# Patient Record
Sex: Male | Born: 1975 | Race: Black or African American | Hispanic: No | Marital: Married | Smoking: Former smoker
Health system: Southern US, Community
[De-identification: ages and names within clinical notes are randomized; demographics above are authoritative.]

## PROBLEM LIST (undated history)

## (undated) HISTORY — PX: OTHER SURGICAL HISTORY: SHX169

---

## 2015-03-23 ENCOUNTER — Encounter (HOSPITAL_BASED_OUTPATIENT_CLINIC_OR_DEPARTMENT_OTHER): Payer: Self-pay | Admitting: *Deleted

## 2015-03-23 ENCOUNTER — Emergency Department (HOSPITAL_BASED_OUTPATIENT_CLINIC_OR_DEPARTMENT_OTHER): Payer: Self-pay

## 2015-03-23 ENCOUNTER — Emergency Department (HOSPITAL_BASED_OUTPATIENT_CLINIC_OR_DEPARTMENT_OTHER)
Admission: EM | Admit: 2015-03-23 | Discharge: 2015-03-23 | Disposition: A | Discharge status: Home / Self Care | Payer: Self-pay | Attending: Emergency Medicine | Admitting: Emergency Medicine

## 2015-03-23 DIAGNOSIS — T148XXA Other injury of unspecified body region, initial encounter: Secondary | ICD-10-CM

## 2015-03-23 DIAGNOSIS — W298XXA Contact with other powered powered hand tools and household machinery, initial encounter: Secondary | ICD-10-CM | POA: Insufficient documentation

## 2015-03-23 DIAGNOSIS — Y990 Civilian activity done for income or pay: Secondary | ICD-10-CM | POA: Insufficient documentation

## 2015-03-23 DIAGNOSIS — Y9289 Other specified places as the place of occurrence of the external cause: Secondary | ICD-10-CM | POA: Insufficient documentation

## 2015-03-23 DIAGNOSIS — Z23 Encounter for immunization: Secondary | ICD-10-CM | POA: Insufficient documentation

## 2015-03-23 DIAGNOSIS — Y9389 Activity, other specified: Secondary | ICD-10-CM | POA: Insufficient documentation

## 2015-03-23 DIAGNOSIS — S61032A Puncture wound without foreign body of left thumb without damage to nail, initial encounter: Secondary | ICD-10-CM | POA: Insufficient documentation

## 2015-03-23 DIAGNOSIS — T1490XA Injury, unspecified, initial encounter: Secondary | ICD-10-CM

## 2015-03-23 MED ORDER — IBUPROFEN 800 MG PO TABS
800.0000 mg | ORAL_TABLET | Freq: Once | ORAL | Status: AC
  Administered 2015-03-23: 800 mg via ORAL
  Filled 2015-03-23: qty 1

## 2015-03-23 MED ORDER — TETANUS-DIPHTH-ACELL PERTUSSIS 5-2.5-18.5 LF-MCG/0.5 IM SUSP
0.5000 mL | Freq: Once | INTRAMUSCULAR | Status: AC
  Administered 2015-03-23: 0.5 mL via INTRAMUSCULAR
  Filled 2015-03-23: qty 0.5

## 2015-03-23 MED ORDER — CEPHALEXIN 500 MG PO CAPS: 500.0000 mg | ORAL_CAPSULE | Freq: Four times a day (QID) | ORAL | Status: DC

## 2015-03-23 NOTE — ED Provider Notes (Signed)
CSN: [id number]     Arrival date & time 03/23/15  0009 History   This chart was scribed for Shevette Bess, MD by Evon Slack, ED Scribe. This patient was seen in room MH10/MH10 and the patient's care was started at 12:24 AM.    Chief Complaint  Patient presents with  . thumb injury    Patient is a 39 y.o. male presenting with hand injury. The history is provided by the patient. No language interpreter was used.  Hand Injury Location:  Finger Injury: yes   Mechanism of injury comment:  Put screwdriver through Finger location:  R thumb Pain details:    Quality:  Aching   Radiates to:  Does not radiate   Severity:  Moderate   Onset quality:  Sudden   Timing:  Constant   Progression:  Unchanged Chronicity:  New Handedness:  Right-handed Dislocation: no   Foreign body present:  No foreign bodies Tetanus status:  Unknown Prior injury to area:  Yes Relieved by:  Nothing Worsened by:  Nothing tried Ineffective treatments:  None tried Associated symptoms: no back pain   Risk factors: no concern for non-accidental trauma    HPI Comments: Zebulon Rorke is a 40 y.o. male who presents to the Emergency Department complaining of left thumb injury onset 1 hour PTA. Pt states that he drilled in to his left thumb. Pt states that he is unsure of his last tetanus shot. Pt states that he is right hand dominant. Pt denies any other injures.   History reviewed. No pertinent past medical history. History reviewed. No pertinent past surgical history. No family history on file. History  Substance Use Topics  . Smoking status: Never Smoker   . Smokeless tobacco: Not on file  . Alcohol Use: No    Review of Systems  Musculoskeletal: Negative for back pain.  Skin: Positive for wound.  All other systems reviewed and are negative.    Allergies  Review of patient's allergies indicates no known allergies.  Home Medications   Prior to Admission medications   Not on File   BP 132/73 mmHg   Pulse 74  Temp(Src) 98.2 F (36.8 C) (Oral)  Resp 20  Ht 5' 10.5" (1.791 m)  Wt 230 lb (104.327 kg)  BMI 32.52 kg/m2  SpO2 98%   Physical Exam  Constitutional: He is oriented to person, place, and time. He appears well-developed and well-nourished. No distress.  HENT:  Head: Normocephalic and atraumatic.  Mouth/Throat: Oropharynx is clear and moist.  Eyes: Conjunctivae and EOM are normal. Pupils are equal, round, and reactive to light.  Neck: Normal range of motion. Neck supple. No tracheal deviation present.  Cardiovascular: Normal rate, regular rhythm and normal heart sounds.   Pulmonary/Chest: Effort normal. No respiratory distress. He has no wheezes. He has no rales.  Abdominal: Soft. Bowel sounds are normal.  Musculoskeletal: Normal range of motion.       Right hand: He exhibits normal range of motion, no bony tenderness, normal two-point discrimination, normal capillary refill, no deformity, no laceration and no swelling. Normal sensation noted. Normal strength noted.       Hands: 3 mm puncture  Through medial tip of left thumb, NVI.   Neurological: He is alert and oriented to person, place, and time.  Skin: Skin is warm and dry.  Psychiatric: He has a normal mood and affect. His behavior is normal.  Nursing note and vitals reviewed.   ED Course  Procedures (including critical care time) DIAGNOSTIC STUDIES:  Oxygen Saturation is 98% on RA, normal by my interpretation.    COORDINATION OF CARE: 12:34 AM-Discussed treatment plan with pt at bedside and pt agreed to plan.     Labs Review Labs Reviewed - No data to display  Imaging Review No results found.   EKG Interpretation None      MDM   Final diagnoses:  None    Keflex, wound care instructions given and PRN follow up with hand surgery.  Patient and family verbalize understanding and agree to follow up  I personally performed the services described in this documentation, which was scribed in my presence.  The recorded information has been reviewed and is accurate.       Cy BlamerApril Clell Trahan, MD 03/23/15 [id number]

## 2015-03-23 NOTE — ED Notes (Signed)
MD with pt  

## 2015-03-23 NOTE — ED Notes (Signed)
Wound care completed with bacitracin, 2x2, then kelix wrap, then metal splint, then secured with coban. Patient given additional supplies for home.

## 2015-03-23 NOTE — ED Notes (Signed)
Pt states that he drilled into his left thumb about 1 hour PTA. Small pinpt area noted to his left thumb. No bleeding at present. 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

## 2015-08-08 ENCOUNTER — Encounter (HOSPITAL_COMMUNITY)
Admission: EM | Disposition: A | Discharge status: Home / Self Care | Payer: Self-pay | Source: Home / Self Care | Attending: Emergency Medicine

## 2015-08-08 ENCOUNTER — Observation Stay (HOSPITAL_BASED_OUTPATIENT_CLINIC_OR_DEPARTMENT_OTHER)
Admission: EM | Admit: 2015-08-08 | Discharge: 2015-08-09 | Disposition: A | Discharge status: Home / Self Care | Payer: Self-pay | Attending: Surgery | Admitting: Surgery

## 2015-08-08 ENCOUNTER — Observation Stay (HOSPITAL_COMMUNITY): Payer: Self-pay | Admitting: Anesthesiology

## 2015-08-08 ENCOUNTER — Encounter (HOSPITAL_BASED_OUTPATIENT_CLINIC_OR_DEPARTMENT_OTHER): Payer: Self-pay | Admitting: Emergency Medicine

## 2015-08-08 ENCOUNTER — Emergency Department (HOSPITAL_BASED_OUTPATIENT_CLINIC_OR_DEPARTMENT_OTHER): Payer: Self-pay

## 2015-08-08 DIAGNOSIS — K3580 Unspecified acute appendicitis: Principal | ICD-10-CM | POA: Diagnosis present

## 2015-08-08 DIAGNOSIS — K3589 Other acute appendicitis without perforation or gangrene: Secondary | ICD-10-CM

## 2015-08-08 DIAGNOSIS — Z87891 Personal history of nicotine dependence: Secondary | ICD-10-CM | POA: Insufficient documentation

## 2015-08-08 HISTORY — PX: LAPAROSCOPIC APPENDECTOMY: SHX408

## 2015-08-08 LAB — CBC
HEMATOCRIT: 45.9 % (ref 39.0–52.0)
Hemoglobin: 15.7 g/dL (ref 13.0–17.0)
MCH: 28.6 pg (ref 26.0–34.0)
MCHC: 34.2 g/dL (ref 30.0–36.0)
MCV: 83.8 fL (ref 78.0–100.0)
PLATELETS: 240 10*3/uL (ref 150–400)
RBC: 5.48 MIL/uL (ref 4.22–5.81)
RDW: 14 % (ref 11.5–15.5)
WBC: 11.4 10*3/uL — ABNORMAL HIGH (ref 4.0–10.5)

## 2015-08-08 LAB — COMPREHENSIVE METABOLIC PANEL
ALT: 19 U/L (ref 17–63)
AST: 30 U/L (ref 15–41)
Albumin: 4.3 g/dL (ref 3.5–5.0)
Alkaline Phosphatase: 62 U/L (ref 38–126)
Anion gap: 7 (ref 5–15)
BILIRUBIN TOTAL: 1 mg/dL (ref 0.3–1.2)
BUN: 17 mg/dL (ref 6–20)
CO2: 27 mmol/L (ref 22–32)
Calcium: 9 mg/dL (ref 8.9–10.3)
Chloride: 103 mmol/L (ref 101–111)
Creatinine, Ser: 1.03 mg/dL (ref 0.61–1.24)
GFR calc Af Amer: >60 mL/min (ref >60)
Glucose, Bld: 136 mg/dL — ABNORMAL HIGH (ref 65–99)
Potassium: 3.7 mmol/L (ref 3.5–5.1)
Sodium: 137 mmol/L (ref 135–145)
TOTAL PROTEIN: 7.3 g/dL (ref 6.5–8.1)

## 2015-08-08 LAB — DIFFERENTIAL
BASOS ABS: 0 10*3/uL (ref 0.0–0.1)
BASOS PCT: 0 %
EOS ABS: 0 10*3/uL (ref 0.0–0.7)
Eosinophils Relative: 0 %
Lymphocytes Relative: 9 %
Lymphs Abs: 1.1 10*3/uL (ref 0.7–4.0)
Monocytes Absolute: 0.9 10*3/uL (ref 0.1–1.0)
Monocytes Relative: 8 %
NEUTROS PCT: 83 %
Neutro Abs: 9.4 10*3/uL — ABNORMAL HIGH (ref 1.7–7.7)

## 2015-08-08 LAB — URINALYSIS, ROUTINE W REFLEX MICROSCOPIC
Bilirubin Urine: NEGATIVE
Glucose, UA: NEGATIVE mg/dL
Hgb urine dipstick: NEGATIVE
KETONES UR: 40 mg/dL — AB
Leukocytes, UA: NEGATIVE
NITRITE: NEGATIVE
Protein, ur: NEGATIVE mg/dL
Specific Gravity, Urine: >1.046 — ABNORMAL HIGH (ref 1.005–1.030)
UROBILINOGEN UA: 0.2 mg/dL (ref 0.0–1.0)
pH: 7 (ref 5.0–8.0)

## 2015-08-08 LAB — SURGICAL PCR SCREEN
MRSA, PCR: NEGATIVE
STAPHYLOCOCCUS AUREUS: NEGATIVE

## 2015-08-08 LAB — LIPASE, BLOOD: Lipase: 20 U/L — ABNORMAL LOW (ref 22–51)

## 2015-08-08 SURGERY — APPENDECTOMY, LAPAROSCOPIC
Anesthesia: General | Site: Abdomen

## 2015-08-08 MED ORDER — LACTATED RINGERS IR SOLN
Status: DC | PRN
  Administered 2015-08-08: 2000 mL

## 2015-08-08 MED ORDER — PROPOFOL 10 MG/ML IV BOLUS
INTRAVENOUS | Status: DC | PRN
  Administered 2015-08-08: 200 mg via INTRAVENOUS

## 2015-08-08 MED ORDER — HYDROMORPHONE HCL 1 MG/ML IJ SOLN
1.0000 mg | INTRAMUSCULAR | Status: DC | PRN
Start: 2015-08-08 — End: 2015-08-09
  Administered 2015-08-08: 1.5 mg via INTRAVENOUS
  Filled 2015-08-08: qty 2

## 2015-08-08 MED ORDER — HYDROMORPHONE HCL 1 MG/ML IJ SOLN
1.0000 mg | Freq: Once | INTRAMUSCULAR | Status: AC
  Administered 2015-08-08: 1 mg via INTRAVENOUS
  Filled 2015-08-08: qty 1

## 2015-08-08 MED ORDER — PIPERACILLIN-TAZOBACTAM 3.375 G IVPB 30 MIN
3.3750 g | Freq: Once | INTRAVENOUS | Status: AC
  Administered 2015-08-08: 3.375 g via INTRAVENOUS
  Filled 2015-08-08 (×2): qty 50

## 2015-08-08 MED ORDER — PROPOFOL 10 MG/ML IV BOLUS
INTRAVENOUS | Status: AC
Start: 2015-08-08 — End: 2015-08-08
  Filled 2015-08-08: qty 20

## 2015-08-08 MED ORDER — 0.9 % SODIUM CHLORIDE (POUR BTL) OPTIME
TOPICAL | Status: DC | PRN
  Administered 2015-08-08: 1000 mL

## 2015-08-08 MED ORDER — IOHEXOL 300 MG/ML  SOLN
25.0000 mL | Freq: Once | INTRAMUSCULAR | Status: AC | PRN
  Administered 2015-08-08: 25 mL via ORAL

## 2015-08-08 MED ORDER — NEOSTIGMINE METHYLSULFATE 10 MG/10ML IV SOLN
INTRAVENOUS | Status: AC
  Filled 2015-08-08: qty 1

## 2015-08-08 MED ORDER — SODIUM CHLORIDE 0.9 % IV SOLN
INTRAVENOUS | Status: DC
  Administered 2015-08-08: 11:00:00 via INTRAVENOUS

## 2015-08-08 MED ORDER — ONDANSETRON HCL 4 MG/2ML IJ SOLN
INTRAMUSCULAR | Status: AC
  Filled 2015-08-08: qty 2

## 2015-08-08 MED ORDER — LACTATED RINGERS IV SOLN
INTRAVENOUS | Status: DC | PRN
  Administered 2015-08-08 (×2): via INTRAVENOUS

## 2015-08-08 MED ORDER — PIPERACILLIN-TAZOBACTAM 3.375 G IVPB: 3.3750 g | Freq: Three times a day (TID) | INTRAVENOUS | Status: DC

## 2015-08-08 MED ORDER — PROMETHAZINE HCL 25 MG/ML IJ SOLN: 6.2500 mg | INTRAMUSCULAR | Status: DC | PRN

## 2015-08-08 MED ORDER — FENTANYL CITRATE (PF) 100 MCG/2ML IJ SOLN
INTRAMUSCULAR | Status: DC | PRN
  Administered 2015-08-08: 50 ug via INTRAVENOUS
  Administered 2015-08-08 (×2): 100 ug via INTRAVENOUS

## 2015-08-08 MED ORDER — OXYCODONE HCL 5 MG PO TABS
5.0000 mg | ORAL_TABLET | ORAL | Status: DC | PRN
Start: 2015-08-08 — End: 2015-08-09
  Administered 2015-08-08: 5 mg via ORAL
  Administered 2015-08-09: 10 mg via ORAL
  Filled 2015-08-08: qty 2
  Filled 2015-08-08: qty 1

## 2015-08-08 MED ORDER — SUCCINYLCHOLINE CHLORIDE 20 MG/ML IJ SOLN
INTRAMUSCULAR | Status: DC | PRN
  Administered 2015-08-08: 100 mg via INTRAVENOUS

## 2015-08-08 MED ORDER — LIDOCAINE HCL (CARDIAC) 20 MG/ML IV SOLN
INTRAVENOUS | Status: AC
  Filled 2015-08-08: qty 5

## 2015-08-08 MED ORDER — ONDANSETRON HCL 4 MG/2ML IJ SOLN
4.0000 mg | Freq: Four times a day (QID) | INTRAMUSCULAR | Status: DC | PRN
  Administered 2015-08-08: 4 mg via INTRAVENOUS
  Filled 2015-08-08: qty 2

## 2015-08-08 MED ORDER — POTASSIUM CHLORIDE IN NACL 20-0.9 MEQ/L-% IV SOLN
INTRAVENOUS | Status: DC
  Administered 2015-08-08: 1000 mL via INTRAVENOUS
  Filled 2015-08-08 (×2): qty 1000

## 2015-08-08 MED ORDER — HYDROMORPHONE HCL 1 MG/ML IJ SOLN: 0.5000 mg | INTRAMUSCULAR | Status: DC | PRN

## 2015-08-08 MED ORDER — LIDOCAINE HCL (CARDIAC) 20 MG/ML IV SOLN
INTRAVENOUS | Status: DC | PRN
  Administered 2015-08-08: 100 mg via INTRAVENOUS

## 2015-08-08 MED ORDER — NEOSTIGMINE METHYLSULFATE 10 MG/10ML IV SOLN
INTRAVENOUS | Status: DC | PRN
  Administered 2015-08-08: 4 mg via INTRAVENOUS

## 2015-08-08 MED ORDER — ONDANSETRON 4 MG PO TBDP: 4.0000 mg | ORAL_TABLET | Freq: Four times a day (QID) | ORAL | Status: DC | PRN

## 2015-08-08 MED ORDER — POTASSIUM CHLORIDE IN NACL 20-0.9 MEQ/L-% IV SOLN
INTRAVENOUS | Status: DC
  Filled 2015-08-08 (×3): qty 1000

## 2015-08-08 MED ORDER — IOHEXOL 300 MG/ML  SOLN
100.0000 mL | Freq: Once | INTRAMUSCULAR | Status: AC | PRN
  Administered 2015-08-08: 100 mL via INTRAVENOUS

## 2015-08-08 MED ORDER — DEXAMETHASONE SODIUM PHOSPHATE 10 MG/ML IJ SOLN
INTRAMUSCULAR | Status: DC | PRN
  Administered 2015-08-08: 10 mg via INTRAVENOUS

## 2015-08-08 MED ORDER — FENTANYL CITRATE (PF) 250 MCG/5ML IJ SOLN
INTRAMUSCULAR | Status: AC
Start: 2015-08-08 — End: 2015-08-08
  Filled 2015-08-08: qty 25

## 2015-08-08 MED ORDER — BUPIVACAINE-EPINEPHRINE (PF) 0.5% -1:200000 IJ SOLN
INTRAMUSCULAR | Status: AC
  Filled 2015-08-08: qty 30

## 2015-08-08 MED ORDER — DEXAMETHASONE SODIUM PHOSPHATE 10 MG/ML IJ SOLN
INTRAMUSCULAR | Status: AC
  Filled 2015-08-08: qty 1

## 2015-08-08 MED ORDER — ROCURONIUM BROMIDE 100 MG/10ML IV SOLN
INTRAVENOUS | Status: DC | PRN
  Administered 2015-08-08: 40 mg via INTRAVENOUS

## 2015-08-08 MED ORDER — PIPERACILLIN-TAZOBACTAM 3.375 G IVPB
3.3750 g | Freq: Three times a day (TID) | INTRAVENOUS | Status: DC
  Administered 2015-08-09: 3.375 g via INTRAVENOUS
  Filled 2015-08-08 (×3): qty 50

## 2015-08-08 MED ORDER — GLYCOPYRROLATE 0.2 MG/ML IJ SOLN
INTRAMUSCULAR | Status: AC
  Filled 2015-08-08: qty 3

## 2015-08-08 MED ORDER — ROCURONIUM BROMIDE 100 MG/10ML IV SOLN
INTRAVENOUS | Status: AC
  Filled 2015-08-08: qty 1

## 2015-08-08 MED ORDER — GLYCOPYRROLATE 0.2 MG/ML IJ SOLN
INTRAMUSCULAR | Status: DC | PRN
  Administered 2015-08-08: .8 mg via INTRAVENOUS

## 2015-08-08 MED ORDER — ONDANSETRON HCL 4 MG/2ML IJ SOLN
INTRAMUSCULAR | Status: DC | PRN
  Administered 2015-08-08: 4 mg via INTRAVENOUS

## 2015-08-08 MED ORDER — ONDANSETRON HCL 4 MG/2ML IJ SOLN
4.0000 mg | Freq: Once | INTRAMUSCULAR | Status: AC
  Administered 2015-08-08: 4 mg via INTRAVENOUS
  Filled 2015-08-08: qty 2

## 2015-08-08 MED ORDER — SODIUM CHLORIDE 0.9 % IV BOLUS (SEPSIS)
1000.0000 mL | Freq: Once | INTRAVENOUS | Status: AC
  Administered 2015-08-08: 1000 mL via INTRAVENOUS

## 2015-08-08 MED ORDER — BUPIVACAINE-EPINEPHRINE 0.5% -1:200000 IJ SOLN
INTRAMUSCULAR | Status: DC | PRN
  Administered 2015-08-08: 10 mL

## 2015-08-08 SURGICAL SUPPLY — 36 items
APPLIER CLIP ROT 10 11.4 M/L (STAPLE)
BENZOIN TINCTURE PRP APPL 2/3 (GAUZE/BANDAGES/DRESSINGS) ×3 IMPLANT
CLIP APPLIE ROT 10 11.4 M/L (STAPLE) IMPLANT
CLOSURE WOUND 1/2 X4 (GAUZE/BANDAGES/DRESSINGS) ×1
COVER SURGICAL LIGHT HANDLE (MISCELLANEOUS) ×3 IMPLANT
CUTTER FLEX LINEAR 45M (STAPLE) ×3 IMPLANT
DECANTER SPIKE VIAL GLASS SM (MISCELLANEOUS) ×3 IMPLANT
DRAPE LAPAROSCOPIC ABDOMINAL (DRAPES) ×3 IMPLANT
ELECT PENCIL ROCKER SW 15FT (MISCELLANEOUS) IMPLANT
ELECT REM PT RETURN 9FT ADLT (ELECTROSURGICAL) ×3
ELECTRODE REM PT RTRN 9FT ADLT (ELECTROSURGICAL) ×1 IMPLANT
ENDOLOOP SUT PDS II  0 18 (SUTURE)
ENDOLOOP SUT PDS II 0 18 (SUTURE) IMPLANT
GLOVE BIOGEL PI IND STRL 7.0 (GLOVE) ×1 IMPLANT
GLOVE BIOGEL PI INDICATOR 7.0 (GLOVE) ×2
GLOVE SURG ORTHO 8.0 STRL STRW (GLOVE) ×3 IMPLANT
GOWN STRL REUS W/TWL LRG LVL3 (GOWN DISPOSABLE) ×3 IMPLANT
GOWN STRL REUS W/TWL XL LVL3 (GOWN DISPOSABLE) ×6 IMPLANT
KIT BASIN OR (CUSTOM PROCEDURE TRAY) ×3 IMPLANT
POUCH SPECIMEN RETRIEVAL 10MM (ENDOMECHANICALS) ×3 IMPLANT
RELOAD 45 VASCULAR/THIN (ENDOMECHANICALS) IMPLANT
RELOAD STAPLE TA45 3.5 REG BLU (ENDOMECHANICALS) ×3 IMPLANT
SET IRRIG TUBING LAPAROSCOPIC (IRRIGATION / IRRIGATOR) ×3 IMPLANT
SHEARS HARMONIC ACE PLUS 36CM (ENDOMECHANICALS) ×3 IMPLANT
SOLUTION ANTI FOG 6CC (MISCELLANEOUS) ×3 IMPLANT
STRIP CLOSURE SKIN 1/2X4 (GAUZE/BANDAGES/DRESSINGS) ×2 IMPLANT
SUT MNCRL AB 4-0 PS2 18 (SUTURE) ×3 IMPLANT
TOWEL OR 17X26 10 PK STRL BLUE (TOWEL DISPOSABLE) ×3 IMPLANT
TRAY FOLEY W/METER SILVER 14FR (SET/KITS/TRAYS/PACK) ×3 IMPLANT
TRAY FOLEY W/METER SILVER 16FR (SET/KITS/TRAYS/PACK) ×3 IMPLANT
TRAY LAPAROSCOPIC (CUSTOM PROCEDURE TRAY) ×3 IMPLANT
TROCAR BLADELESS OPT 5 75 (ENDOMECHANICALS) ×3 IMPLANT
TROCAR XCEL BLUNT TIP 100MML (ENDOMECHANICALS) ×3 IMPLANT
TROCAR XCEL NON-BLD 11X100MML (ENDOMECHANICALS) ×3 IMPLANT
TUBING INSUFFLATION 10FT LAP (TUBING) ×3 IMPLANT
WATER STERILE IRR 1500ML POUR (IV SOLUTION) ×3 IMPLANT

## 2015-08-08 NOTE — Discharge Instructions (Addendum)
LAPAROSCOPIC SURGERY: POST OP INSTRUCTIONS ° °1. DIET: Follow a light bland diet the first 24 hours after arrival home, such as soup, liquids, crackers, etc.  Be sure to include lots of fluids daily.  Avoid fast food or heavy meals as your are more likely to get nauseated.  Eat a low fat the next few days after surgery.   °2. Take your usually prescribed home medications unless otherwise directed. °3. PAIN CONTROL: °a. Pain is best controlled by a usual combination of three different methods TOGETHER: °i. Ice/Heat °ii. Over the counter pain medication °iii. Prescription pain medication °b. Most patients will experience some swelling and bruising around the incisions.  Ice packs or heating pads (30-60 minutes up to 6 times a day) will help. Use ice for the first few days to help decrease swelling and bruising, then switch to heat to help relax tight/sore spots and speed recovery.  Some people prefer to use ice alone, heat alone, alternating between ice & heat.  Experiment to what works for you.  Swelling and bruising can take several weeks to resolve.   °c. It is helpful to take an over-the-counter pain medication regularly for the first few weeks.  Choose one of the following that works best for you: °i. Naproxen (Aleve, etc)  Two 220mg tabs twice a day °ii. Ibuprofen (Advil, etc) Three 200mg tabs four times a day (every meal & bedtime) °iii. Acetaminophen (Tylenol, etc) 500-650mg four times a day (every meal & bedtime) °d. A  prescription for pain medication (such as oxycodone, hydrocodone, etc) should be given to you upon discharge.  Take your pain medication as prescribed.  °i. If you are having problems/concerns with the prescription medicine (does not control pain, nausea, vomiting, rash, itching, etc), please call us [phone] to see if we need to switch you to a different pain medicine that will work better for you and/or control your side effect better. °ii. If you need a refill on your pain medication,  please contact your pharmacy.  They will contact our office to request authorization. Prescriptions will not be filled after 5 pm or on week-ends. °4. Avoid getting constipated.  Between the surgery and the pain medications, it is common to experience some constipation.  Increasing fluid intake and taking a fiber supplement (such as Metamucil, Citrucel, FiberCon, MiraLax, etc) 1-2 times a day regularly will usually help prevent this problem from occurring.  A mild laxative (prune juice, Milk of Magnesia, MiraLax, etc) should be taken according to package directions if there are no bowel movements after 48 hours.   °5. Watch out for diarrhea.  If you have many loose bowel movements, simplify your diet to bland foods & liquids for a few days.  Stop any stool softeners and decrease your fiber supplement.  Switching to mild anti-diarrheal medications (Kayopectate, Pepto Bismol) can help.  If this worsens or does not improve, please call us. °6. Wash / shower every day.  You may shower over the dressings as they are waterproof.  Continue to shower over incision(s) after the dressing is off. °7. Remove your waterproof bandages 3 days after surgery.  You may leave the incision open to air.  You may replace a dressing/Band-Aid to cover the incision for comfort if you wish.  °8. ACTIVITIES as tolerated:   °a. You may resume regular (light) daily activities beginning the next day--such as daily self-care, walking, climbing stairs--gradually increasing light activities as tolerated.  No heavy lifting (over 10 pounds), straining, or   intense activities for 2 weeks. b. DO NOT PUSH THROUGH PAIN.  Let pain be your guide: If it hurts to do something, don't do it.  Pain is your body warning you to avoid that activity for another week until the pain goes down. c. You may drive when you are no longer taking prescription pain medication, you can comfortably wear a seatbelt, and you can safely maneuver your car and apply  brakes. d. Bonita QuinYou may have sexual intercourse when it is comfortable.  9. FOLLOW UP in our office a. Please call [facility] at [phone] to set up an appointment to see your surgeon or the [facility] in the office for a follow-up appointment approximately 2-3 weeks after your surgery. b. Make sure that you call for this appointment the day you arrive home to insure a convenient appointment time. 10. IF YOU HAVE DISABILITY OR FAMILY LEAVE FORMS, BRING THEM TO THE OFFICE FOR PROCESSING.  DO NOT GIVE THEM TO YOUR DOCTOR.  11.  Return to work/school:  Desk work/light activities in 5-7 days, full duty/activities in 2 weeks if pain-free.   WHEN TO CALL US [phone]: 1. Poor pain control 2. Reactions / problems with new medications (rash/itching, nausea, etc)  3. Fever over 101.5 F (38.5 C) 4. Inability to urinate 5. Nausea and/or vomiting 6. Worsening swelling or bruising 7. Continued bleeding from incision. 8. Increased pain, redness, or drainage from the incision   The clinic staff is available to answer your questions during regular business hours (8:30am-5pm).  Please dont hesitate to call and ask to speak to one of our nurses for clinical concerns.   If you have a medical emergency, go to the nearest emergency room or call 911.  A [facility] from [facility] is always on call at the [facility]   [facility], [place] [address], [address], [place], [place]  [address] ? MAIN: [phone] ? TOLL FREE: [phone] ?  FAX [phone] [email or web address]

## 2015-08-08 NOTE — Transfer of Care (Signed)
Immediate Anesthesia Transfer of Care Note  Patient: Hunter Combs  Procedure(s) Performed: Procedure(s): APPENDECTOMY LAPAROSCOPIC (N/A)  Patient Location: PACU  Anesthesia Type:General  Level of Consciousness: awake, alert , oriented and patient cooperative  Airway & Oxygen Therapy: Patient Spontanous Breathing and Patient connected to face mask oxygen  Post-op Assessment: Report given to RN, Post -op Vital signs reviewed and stable and Patient moving all extremities  Post vital signs: Reviewed and stable  Last Vitals:  Filed Vitals:   08/08/15 1512  BP: 126/80  Pulse:   Temp: 36.9 C  Resp: 16    Complications: No apparent anesthesia complications

## 2015-08-08 NOTE — ED Notes (Signed)
Bed: [id number] Expected date: 08/08/15 Expected time:  Means of arrival:  Comments: Hold for med cnter hp acute app surgery consult

## 2015-08-08 NOTE — ED Provider Notes (Signed)
Medical screening exam:  Patient sent from [facility] to be treated for acute appendicitis. Patient had been seen in the ER there and diagnosed with appendicitis by CAT scan. Patient comfortable at arrival, pain well controlled. Vital signs normal. Will consult surgery for definitive care.  Hunter Creasehristopher J Pollina, MD 08/08/15 1340

## 2015-08-08 NOTE — Anesthesia Procedure Notes (Signed)
Procedure Name: Intubation Date/Time: 08/08/2015 5:45 PM Performed by: Thornell MuleSTUBBLEFIELD, Lorana Maffeo G Pre-anesthesia Checklist: Patient identified, Emergency Drugs available, Suction available and Patient being monitored Patient Re-evaluated:Patient Re-evaluated prior to inductionOxygen Delivery Method: Circle System Utilized Preoxygenation: Pre-oxygenation with 100% oxygen Intubation Type: IV induction, Cricoid Pressure applied and Rapid sequence Ventilation: Mask ventilation without difficulty Laryngoscope Size: Miller and 3 Grade View: Grade I Tube type: Oral Tube size: 7.5 mm Number of attempts: 1 Airway Equipment and Method: Stylet and Oral airway Placement Confirmation: ETT inserted through vocal cords under direct vision,  positive ETCO2 and breath sounds checked- equal and bilateral Secured at: 22 cm Tube secured with: Tape Dental Injury: Teeth and Oropharynx as per pre-operative assessment

## 2015-08-08 NOTE — ED Notes (Signed)
Patient states that he has had Pain and N/V since last night.

## 2015-08-08 NOTE — ED Provider Notes (Addendum)
CSN: [id number]     Arrival date & time 08/08/15  4098 History   First MD Initiated Contact with Patient 08/08/15 [phone]     Chief Complaint  Patient presents with  . Abdominal Pain     (Consider location/radiation/quality/duration/timing/severity/associated sxs/prior Treatment) Patient is a 39 y.o. male presenting with abdominal pain. The history is provided by the patient and the spouse.  Abdominal Pain Associated symptoms: fever, nausea and vomiting   Associated symptoms: no chest pain, no diarrhea, no dysuria and no shortness of breath    Patient with onset of abdominal pain at 7 PM last evening. Associated with several episodes of nausea and vomiting. Pain is now in the right lower quadrant of the abdomen. Patient is felt subjectively like he had fevers. No diarrhea. No dysuria. Prior surgery includes a hernia surgery in the groin patient believes on the left side. Pain is 10 out of 10 does not radiate to the back. Pain is been persistent since it started.  History reviewed. No pertinent past medical history. History reviewed. No pertinent past surgical history. History reviewed. No pertinent family history. Social History  Substance Use Topics  . Smoking status: Never Smoker   . Smokeless tobacco: None  . Alcohol Use: No    Review of Systems  Constitutional: Positive for fever.  HENT: Negative for congestion.   Eyes: Negative for redness.  Respiratory: Negative for shortness of breath.   Cardiovascular: Negative for chest pain.  Gastrointestinal: Positive for nausea, vomiting and abdominal pain. Negative for diarrhea.  Genitourinary: Negative for dysuria.  Musculoskeletal: Negative for back pain.  Skin: Negative for rash.  Neurological: Negative for headaches.  Hematological: Does not bruise/bleed easily.  Psychiatric/Behavioral: Negative for confusion.      Allergies  Review of patient's allergies indicates no known allergies.  Home Medications   Prior to  Admission medications   Medication Sig Start Date End Date Taking? Authorizing Provider  cephALEXin (KEFLEX) 500 MG capsule Take 1 capsule (500 mg total) by mouth 4 (four) times daily. 03/23/15   April Palumbo, MD   BP 130/68 mmHg  Pulse 70  Temp(Src) 99 F (37.2 C) (Oral)  Resp 17  Ht  (1.778 m)  Wt 235 lb (106.595 kg)  BMI 33.72 kg/m2  SpO2 98% Physical Exam  Constitutional: He is oriented to person, place, and time. He appears well-developed and well-nourished. No distress.  HENT:  Head: Normocephalic and atraumatic.  Mouth/Throat: Oropharynx is clear and moist.  Eyes: Conjunctivae and EOM are normal. Pupils are equal, round, and reactive to light.  Neck: Normal range of motion. Neck supple.  Cardiovascular: Normal rate, regular rhythm and normal heart sounds.   No murmur heard. Pulmonary/Chest: Effort normal and breath sounds normal. No respiratory distress.  Abdominal: Soft. Bowel sounds are normal. There is tenderness. There is guarding.  Musculoskeletal: Normal range of motion. He exhibits no edema.  Neurological: He is alert and oriented to person, place, and time. No cranial nerve deficit. He exhibits normal muscle tone. Coordination normal.  Skin: Skin is warm.  Nursing note and vitals reviewed.   ED Course  Procedures (including critical care time) Labs Review Labs Reviewed  LIPASE, BLOOD - Abnormal; Notable for the following:    Lipase 20 (*)    All other components within normal limits  COMPREHENSIVE METABOLIC PANEL - Abnormal; Notable for the following:    Glucose, Bld 136 (*)    All other components within normal limits  CBC - Abnormal; Notable for the  following:    WBC 11.4 (*)    All other components within normal limits  URINALYSIS, ROUTINE W REFLEX MICROSCOPIC (NOT AT [facility]) - Abnormal; Notable for the following:    Specific Gravity, Urine >1.046 (*)    Ketones, ur 40 (*)    All other components within normal limits  DIFFERENTIAL - Abnormal;  Notable for the following:    Neutro Abs 9.4 (*)    All other components within normal limits   Results for orders placed or performed during the hospital encounter of 08/08/15  Lipase, blood  Result Value Ref Range   Lipase 20 (L) 22 - 51 U/L  Comprehensive metabolic panel  Result Value Ref Range   Sodium 137 135 - 145 mmol/L   Potassium 3.7 3.5 - 5.1 mmol/L   Chloride 103 101 - 111 mmol/L   CO2 27 22 - 32 mmol/L   Glucose, Bld 136 (H) 65 - 99 mg/dL   BUN 17 6 - 20 mg/dL   Creatinine, Ser 1.471.03 0.61 - 1.24 mg/dL   Calcium 9.0 8.9 - 82.910.3 mg/dL   Total Protein 7.3 6.5 - 8.1 g/dL   Albumin 4.3 3.5 - 5.0 g/dL   AST 30 15 - 41 U/L   ALT 19 17 - 63 U/L   Alkaline Phosphatase 62 38 - 126 U/L   Total Bilirubin 1.0 0.3 - 1.2 mg/dL   GFR calc non Af Amer >60 >60 mL/min   GFR calc Af Amer >60 >60 mL/min   Anion gap 7 5 - 15  CBC  Result Value Ref Range   WBC 11.4 (H) 4.0 - 10.5 K/uL   RBC 5.48 4.22 - 5.81 MIL/uL   Hemoglobin 15.7 13.0 - 17.0 g/dL   HCT 56.245.9 13.039.0 - 86.552.0 %   MCV 83.8 78.0 - 100.0 fL   MCH 28.6 26.0 - 34.0 pg   MCHC 34.2 30.0 - 36.0 g/dL   RDW 78.414.0 69.611.5 - 29.515.5 %   Platelets 240 150 - 400 K/uL  Urinalysis, Routine w reflex microscopic (not at [facility])  Result Value Ref Range   Color, Urine YELLOW YELLOW   APPearance CLEAR CLEAR   Specific Gravity, Urine >1.046 (H) 1.005 - 1.030   pH 7.0 5.0 - 8.0   Glucose, UA NEGATIVE NEGATIVE mg/dL   Hgb urine dipstick NEGATIVE NEGATIVE   Bilirubin Urine NEGATIVE NEGATIVE   Ketones, ur 40 (A) NEGATIVE mg/dL   Protein, ur NEGATIVE NEGATIVE mg/dL   Urobilinogen, UA 0.2 0.0 - 1.0 mg/dL   Nitrite NEGATIVE NEGATIVE   Leukocytes, UA NEGATIVE NEGATIVE  Differential  Result Value Ref Range   Neutrophils Relative % 83 %   Neutro Abs 9.4 (H) 1.7 - 7.7 K/uL   Lymphocytes Relative 9 %   Lymphs Abs 1.1 0.7 - 4.0 K/uL   Monocytes Relative 8 %   Monocytes Absolute 0.9 0.1 - 1.0 K/uL   Eosinophils Relative 0 %   Eosinophils Absolute 0.0  0.0 - 0.7 K/uL   Basophils Relative 0 %   Basophils Absolute 0.0 0.0 - 0.1 K/uL     Imaging Review Ct Abdomen Pelvis W Contrast  08/08/2015  CLINICAL DATA:  Right upper abdominal pain, nausea, vomiting for 1 date EXAM: CT ABDOMEN AND PELVIS WITH CONTRAST TECHNIQUE: Multidetector CT imaging of the abdomen and pelvis was performed using the standard protocol following bolus administration of intravenous contrast. CONTRAST:  25mL OMNIPAQUE IOHEXOL 300 MG/ML SOLN, 100mL OMNIPAQUE IOHEXOL 300 MG/ML SOLN COMPARISON:  None. FINDINGS: Minimal  dependent atelectasis posteriorly in the lung bases. Unremarkable liver, nondistended gallbladder, spleen, adrenal glands, kidneys, pancreas. Portal vein patent. Stomach physiologically distended. Small bowel and colon are nondilated. The distal appendix is dilated and thick walled with adjacent inflammatory/ edematous changes. There is some small partially calcified acted appendicolith in the lumen. Urinary bladder incompletely distended. Mild prostatic prominence. Left pelvic phlebolith. No ascites. No free air. No adenopathy localized. Mild degenerative disc disease in the lower lumbar spine. IMPRESSION: 1. Acute appendicitis without rupture or abscess. Critical Value/emergent results were called by telephone at the time of interpretation on 08/08/2015 at 11:11 am to Dr. Vanetta Mulders , who verbally acknowledged these results. Electronically Signed   By: Corlis Leak M.D.   On: 08/08/2015 11:12   I have personally reviewed and evaluated these images and lab results as part of my medical decision-making.   EKG Interpretation None      MDM   Final diagnoses:  Other acute appendicitis   CT scan and labs consistent with acute appendicitis. Mild leukocytosis. Liver function tests negative lipase negative no evidence of pancreatitis. CT can is consistent with acute appendicitis without rupture or abscess. We'll discuss with surgery on call.  Accepted by general  surgery for transfer to [facility] long emergency department in preparation for surgery for acute appendicitis. Discussed with Will. Surgeon on call today is [facility].   Vanetta Mulders, MD 08/08/15 1121  Vanetta Mulders, MD 08/08/15 [phone]

## 2015-08-08 NOTE — ED Notes (Signed)
[facility] - pt to be transported to [employer] Permit signed and transported with patient [facility] at [facility] at 11:43 Valuables with girlfriend who has been at bedside since arrival

## 2015-08-08 NOTE — Op Note (Signed)
Appendectomy, Lap, Procedure Note  Pre-operative Diagnosis: Acute appendicitis  Post-operative Diagnosis: Same  Procedure:  Laparoscopic appendectomy  Surgeon:  Avel Peaceodd Sanav Remer, M.D.  Anesthesia:  General  Indications:  This is a 39 year old male with the midepigastric abdominal pain that radiated to the RLQ.  CT is consistent with acute appendicitis without abscess.  He is brought to the operating room for appendectomy.  Procedure Details   He was brought to the operating room, placed in the supine position and general anesthesia was induced, along with placement of orogastric tube, SCDs, and a Foley catheter. A timeout was performed.  Hair of the abdominal wall was clipped. The abdomen was prepped and draped in a sterile fashion. A small infraumbilical incision was made through the skin, subcutaneous tissue, fascia, and peritoneum entering the peritoneal cavity under direct vision.  A pursestring suture was passed around the fascia with a 0 Vicryl.  The Hasson was introduced into the peritoneal cavity and the tails of the suture were used to hold the Hasson in place.   The pneumoperitoneum was then established to steady pressure of 15 mmHg.   The laparoscope was introduced and there was no evidence of bleeding or underlying organ injury. Additional 5 mm cannulas were then placed in the left lower quadrant of the abdomen and the right upper quadrant region under direct visualization. A careful evaluation of the entire abdomen was carried out. The patient was placed in Trendelenburg and left lateral decubitus position. The small intestines were retracted in the cephalad and left lateral direction away from the pelvis and right lower quadrant. The patient was found to have an enlarged and inflamed appendix that was extending into the right gutter lateral to the cecum. There was no evidence of perforation, abscess or necrosis.  The appendix was carefully mobilized. The mesoappendix was was divided  with the harmonic scalpel.   The appendix was amputated off the cecum, with a small cuff of cecum, using an endo-GIA stapler.  The appendix was placed in a retrieval bag and removed through the subumbilical port incision.    There was no evidence of bleeding, leakage, or complication after division of the appendix. Copious irrigation was  performed and irrigant fluid suctioned from the abdomen as much as possible.  The umbilical trocar was removed and the  port site fascia was closed via the purse string suture under laparoscopic vision. There was no residual palpable fascial defect.  The remaining trocars were removed and all  trocar site skin wounds were closed with 4-0 Monocryl. Steri strips and sterile dressings were applied.  Instrument, sponge, and needle counts were correct at the conclusion of the case.    Estimated Blood Loss:  150 ml        Specimens: appendix         Complications:  None; patient tolerated the procedure well.         Disposition: PACU - hemodynamically stable.         Condition: stable

## 2015-08-08 NOTE — H&P (Signed)
Hunter Combs is an 39 y.o. male.   Chief Complaint:  Abdominal pain, nausea and vomiting that started last PM HPI: Pt reports pain last PM, mid epigastric area followed by nausea and vomiting.  Pain is now in the RLQ, with ongoing nausea.   He came to the ED this AM at [facility].  Work up there shows he is afebrile, VSS.  Labs show his glucose is up some , [place] is 11.4.  CT scan shows:  The distal appendix is dilated and thick walled with adjacent inflammatory/ edematous changes. There is some small partially calcified acted appendicolith in the lumen.  We are transferring him to [facility] for acute appendicitis.  History reviewed. No pertinent past medical history. None History reviewed. No pertinent past surgical history.  History reviewed. No pertinent family history. Social History:  reports that he has never smoked. He does not have any smokeless tobacco history on file. He reports that he does not drink alcohol or use illicit drugs. Tobaccco:  15 years up to 1.5 PPD ETOH:  Social DRugs:  None   Allergies: No Known Allergies  Prior to Admission medications   Medication Sig Start Date End Date Taking? Authorizing Provider  cephALEXin (KEFLEX) 500 MG capsule Take 1 capsule (500 mg total) by mouth 4 (four) times daily. 03/23/15 completed  April Palumbo, MD     Results for orders placed or performed during the hospital encounter of 08/08/15 (from the past 48 hour(s))  Lipase, blood     Status: Abnormal   Collection Time: 08/08/15  9:05 AM  Result Value Ref Range   Lipase 20 (L) 22 - 51 U/L  Comprehensive metabolic panel     Status: Abnormal   Collection Time: 08/08/15  9:05 AM  Result Value Ref Range   Sodium 137 135 - 145 mmol/L   Potassium 3.7 3.5 - 5.1 mmol/L   Chloride 103 101 - 111 mmol/L   CO2 27 22 - 32 mmol/L   Glucose, Bld 136 (H) 65 - 99 mg/dL   BUN 17 6 - 20 mg/dL   Creatinine, Ser 1.03 0.61 - 1.24 mg/dL   Calcium 9.0 8.9 - 10.3 mg/dL   Total Protein 7.3 6.5 - 8.1 g/dL   Albumin  4.3 3.5 - 5.0 g/dL   AST 30 15 - 41 U/L   ALT 19 17 - 63 U/L   Alkaline Phosphatase 62 38 - 126 U/L   Total Bilirubin 1.0 0.3 - 1.2 mg/dL   GFR calc non Af Amer >60 >60 mL/min   GFR calc Af Amer >60 >60 mL/min    Comment: (NOTE) The eGFR has been calculated using the CKD EPI equation. This calculation has not been validated in all clinical situations. eGFR's persistently <60 mL/min signify possible Chronic Kidney Disease.    Anion gap 7 5 - 15  CBC     Status: Abnormal   Collection Time: 08/08/15  9:05 AM  Result Value Ref Range   WBC 11.4 (H) 4.0 - 10.5 K/uL   RBC 5.48 4.22 - 5.81 MIL/uL   Hemoglobin 15.7 13.0 - 17.0 g/dL   HCT 45.9 39.0 - 52.0 %   MCV 83.8 78.0 - 100.0 fL   MCH 28.6 26.0 - 34.0 pg   MCHC 34.2 30.0 - 36.0 g/dL   RDW 14.0 11.5 - 15.5 %   Platelets 240 150 - 400 K/uL  Differential     Status: Abnormal   Collection Time: 08/08/15  9:05 AM  Result Value Ref  Range   Neutrophils Relative % 83 %   Neutro Abs 9.4 (H) 1.7 - 7.7 K/uL   Lymphocytes Relative 9 %   Lymphs Abs 1.1 0.7 - 4.0 K/uL   Monocytes Relative 8 %   Monocytes Absolute 0.9 0.1 - 1.0 K/uL   Eosinophils Relative 0 %   Eosinophils Absolute 0.0 0.0 - 0.7 K/uL   Basophils Relative 0 %   Basophils Absolute 0.0 0.0 - 0.1 K/uL  Urinalysis, Routine w reflex microscopic (not at [facility])     Status: Abnormal   Collection Time: 08/08/15 10:55 AM  Result Value Ref Range   Color, Urine YELLOW YELLOW   APPearance CLEAR CLEAR   Specific Gravity, Urine >1.046 (H) 1.005 - 1.030   pH 7.0 5.0 - 8.0   Glucose, UA NEGATIVE NEGATIVE mg/dL   Hgb urine dipstick NEGATIVE NEGATIVE   Bilirubin Urine NEGATIVE NEGATIVE   Ketones, ur 40 (A) NEGATIVE mg/dL   Protein, ur NEGATIVE NEGATIVE mg/dL   Urobilinogen, UA 0.2 0.0 - 1.0 mg/dL   Nitrite NEGATIVE NEGATIVE   Leukocytes, UA NEGATIVE NEGATIVE    Comment: MICROSCOPIC NOT DONE ON URINES WITH NEGATIVE PROTEIN, BLOOD, LEUKOCYTES, NITRITE, OR GLUCOSE <1000 mg/dL.   Ct  Abdomen Pelvis W Contrast  08/08/2015  CLINICAL DATA:  Right upper abdominal pain, nausea, vomiting for 1 date EXAM: CT ABDOMEN AND PELVIS WITH CONTRAST TECHNIQUE: Multidetector CT imaging of the abdomen and pelvis was performed using the standard protocol following bolus administration of intravenous contrast. CONTRAST:  94m OMNIPAQUE IOHEXOL 300 MG/ML SOLN, 1066mOMNIPAQUE IOHEXOL 300 MG/ML SOLN COMPARISON:  None. FINDINGS: Minimal dependent atelectasis posteriorly in the lung bases. Unremarkable liver, nondistended gallbladder, spleen, adrenal glands, kidneys, pancreas. Portal vein patent. Stomach physiologically distended. Small bowel and colon are nondilated. The distal appendix is dilated and thick walled with adjacent inflammatory/ edematous changes. There is some small partially calcified acted appendicolith in the lumen. Urinary bladder incompletely distended. Mild prostatic prominence. Left pelvic phlebolith. No ascites. No free air. No adenopathy localized. Mild degenerative disc disease in the lower lumbar spine. IMPRESSION: 1. Acute appendicitis without rupture or abscess. Critical Value/emergent results were called by telephone at the time of interpretation on 08/08/2015 at 11:11 am to Dr. SCFredia Sorrow who verbally acknowledged these results. Electronically Signed   By: D Lucrezia Europe.D.   On: 08/08/2015 11:12    Review of Systems  Constitutional: Negative.  Negative for fever, chills and weight loss.  HENT: Negative.   Respiratory: Negative.   Cardiovascular: Negative.   Gastrointestinal: Positive for nausea, vomiting and abdominal pain. Negative for heartburn, diarrhea, constipation, blood in stool and melena.  Genitourinary: Negative.   Musculoskeletal: Negative.   Skin: Negative.   Neurological: Negative.   Endo/Heme/Allergies: Negative.   Psychiatric/Behavioral: Negative.     Blood pressure 130/68, pulse 70, temperature 99 F (37.2 C), temperature source Oral, resp. rate  17, height 5' 10"  (1.778 m), weight 106.595 kg (235 lb), SpO2 98 %. Physical Exam  Constitutional: He is oriented to person, place, and time. He appears well-developed and well-nourished. No distress.  HENT:  Head: Normocephalic.  Nose: Nose normal.  Eyes: Conjunctivae and EOM are normal. Right eye exhibits no discharge. Left eye exhibits no discharge. No scleral icterus.  Neck: Normal range of motion. Neck supple. No JVD present. No tracheal deviation present. No thyromegaly present.  Cardiovascular: Normal rate, regular rhythm, normal heart sounds and intact distal pulses.   Respiratory: Effort normal and breath sounds normal. No respiratory  distress. He has no wheezes. He exhibits no tenderness.  GI: Soft. Bowel sounds are normal. He exhibits no distension and no mass. There is tenderness (RLQ). There is no rebound and no guarding.  Musculoskeletal: He exhibits no edema or tenderness.  Lymphadenopathy:    He has no cervical adenopathy.  Neurological: He is alert and oriented to person, place, and time. No cranial nerve deficit.  Skin: Skin is warm and dry. No rash noted. He is not diaphoretic. No erythema. No pallor.  Psychiatric: He has a normal mood and affect. His behavior is normal. Judgment and thought content normal.     Assessment/Plan Acute appendicitis  Plan:  NPO, hydrate, antibiotics, and surgery later today.  Zebulun Deman 08/08/2015, 11:33 AM

## 2015-08-08 NOTE — Anesthesia Preprocedure Evaluation (Addendum)
Anesthesia Evaluation  Patient identified by MRN, date of birth, ID band Patient awake    Reviewed: Allergy & Precautions, H&P , NPO status , Patient's Chart, lab work & pertinent test results  History of Anesthesia Complications Negative for: history of anesthetic complications  Airway Mallampati: I  TM Distance: >3 FB Neck ROM: full    Dental no notable dental hx.    Pulmonary neg pulmonary ROS, former smoker,    Pulmonary exam normal breath sounds clear to auscultation       Cardiovascular negative cardio ROS Normal cardiovascular exam Rhythm:regular Rate:Normal     Neuro/Psych negative neurological ROS     GI/Hepatic negative GI ROS, Neg liver ROS,   Endo/Other  negative endocrine ROS  Renal/GU negative Renal ROS     Musculoskeletal   Abdominal (+) - obese,   Peds  Hematology negative hematology ROS (+)   Anesthesia Other Findings   Reproductive/Obstetrics negative OB ROS                           Anesthesia Physical Anesthesia Plan  ASA: II  Anesthesia Plan: General   Post-op Pain Management:    Induction: Intravenous, Rapid sequence and Cricoid pressure planned  Airway Management Planned: Oral ETT  Additional Equipment:   Intra-op Plan:   Post-operative Plan: Extubation in OR  Informed Consent: I have reviewed the patients History and Physical, chart, labs and discussed the procedure including the risks, benefits and alternatives for the proposed anesthesia with the patient or authorized representative who has indicated his/her understanding and acceptance.   Dental Advisory Given  Plan Discussed with: Anesthesiologist, CRNA and Surgeon  Anesthesia Plan Comments:        Anesthesia Quick Evaluation

## 2015-08-09 MED ORDER — OXYCODONE HCL 5 MG PO TABS: 5.0000 mg | ORAL_TABLET | ORAL | Status: AC | PRN

## 2015-08-09 NOTE — Progress Notes (Addendum)
Pt discharged to home with wife.  Pt verbalized understanding of discharge instructions and follow up care.  All belongings sent home with pt.  Edcuation provided re: pain management, incisional care and follow up care.  Sundra AlandMaura S Anuar Walgren, RN

## 2015-08-09 NOTE — Discharge Summary (Signed)
Physician Discharge Summary  Patient ID: Hunter Combs MRN: [id number] DOB/AGE: 11/05/1975 [age] y.o.  Admit date: 08/08/2015 Discharge date: 08/09/2015  Admission Diagnoses:  Acute appendicitis  Discharge Diagnoses:  Active Problems:   Acute appendicitis s/p laparoscopic appendectomy   Discharged Condition: good  Hospital Course: He was admitted and underwent and appendectomy which he tolerated well.  He was able to be discharged on POD#1.  Discharge instructions were given to him.  Discharge Exam: Blood pressure 111/56, pulse 96, temperature 99 F (37.2 C), temperature source Oral, resp. rate 20, height 5\' 10"  (1.778 m), weight 106.595 kg (235 lb), SpO2 95 %.   Disposition: 01-Home or Self Care     Medication List    STOP taking these medications        acetaminophen 500 MG tablet  Commonly known as:  TYLENOL     cephALEXin 500 MG capsule  Commonly known as:  KEFLEX      TAKE these medications        oxyCODONE 5 MG immediate release tablet  Commonly known as:  Oxy IR/ROXICODONE  Take 1-2 tablets (5-10 mg total) by mouth every 4 (four) hours as needed for moderate pain.         Signed: Adolph PollackOSENBOWER,[name] 08/09/2015, 9:47 AM

## 2015-08-09 NOTE — Progress Notes (Signed)
1 Day Post-Op  Subjective: Sore.  Voiding okay.  Tolerating diet.  Objective: Vital signs in last 24 hours: Temp:  [98.2 F (36.8 C)-99.7 F (37.6 C)] 99 F (37.2 C) (10/15 0534) Pulse Rate:  [65-99] 96 (10/15 0534) Resp:  [12-20] 20 (10/15 0534) BP: (106-149)/(49-80) 111/56 mmHg (10/15 0534) SpO2:  [92 %-100 %] 95 % (10/15 0534) Last BM Date: 08/07/15  Intake/Output from previous day: 10/14 0701 - 10/15 0700 In: 3701.7 [P.O.:600; I.V.:3001.7; IV Piggyback:100] Out: 425 [Urine:425] Intake/Output this shift: Total I/O In: 240 [P.O.:240] Out: -   PE: General- In NAD Abdomen-soft, dressings dry  Lab Results:   Recent Labs  08/08/15 0905  WBC 11.4*  HGB 15.7  HCT 45.9  PLT 240   BMET  Recent Labs  08/08/15 0905  NA 137  K 3.7  CL 103  CO2 27  GLUCOSE 136*  BUN 17  CREATININE 1.03  CALCIUM 9.0   PT/INR No results for input(s): LABPROT, INR in the last 72 hours. Comprehensive Metabolic Panel:    Component Value Date/Time   NA 137 08/08/2015 0905   K 3.7 08/08/2015 0905   CL 103 08/08/2015 0905   CO2 27 08/08/2015 0905   BUN 17 08/08/2015 0905   CREATININE 1.03 08/08/2015 0905   GLUCOSE 136* 08/08/2015 0905   CALCIUM 9.0 08/08/2015 0905   AST 30 08/08/2015 0905   ALT 19 08/08/2015 0905   ALKPHOS 62 08/08/2015 0905   BILITOT 1.0 08/08/2015 0905   PROT 7.3 08/08/2015 0905   ALBUMIN 4.3 08/08/2015 0905     Studies/Results: Ct Abdomen Pelvis W Contrast  08/08/2015  CLINICAL DATA:  Right upper abdominal pain, nausea, vomiting for 1 date EXAM: CT ABDOMEN AND PELVIS WITH CONTRAST TECHNIQUE: Multidetector CT imaging of the abdomen and pelvis was performed using the standard protocol following bolus administration of intravenous contrast. CONTRAST:  25mL OMNIPAQUE IOHEXOL 300 MG/ML SOLN, 100mL OMNIPAQUE IOHEXOL 300 MG/ML SOLN COMPARISON:  None. FINDINGS: Minimal dependent atelectasis posteriorly in the lung bases. Unremarkable liver, nondistended  gallbladder, spleen, adrenal glands, kidneys, pancreas. Portal vein patent. Stomach physiologically distended. Small bowel and colon are nondilated. The distal appendix is dilated and thick walled with adjacent inflammatory/ edematous changes. There is some small partially calcified acted appendicolith in the lumen. Urinary bladder incompletely distended. Mild prostatic prominence. Left pelvic phlebolith. No ascites. No free air. No adenopathy localized. Mild degenerative disc disease in the lower lumbar spine. IMPRESSION: 1. Acute appendicitis without rupture or abscess. Critical Value/emergent results were called by telephone at the time of interpretation on 08/08/2015 at 11:11 am to Dr. Vanetta MuldersSCOTT ZACKOWSKI , who verbally acknowledged these results. Electronically Signed   By: Corlis Leak  Hassell M.D.   On: 08/08/2015 11:12    Anti-infectives: Anti-infectives    Start     Dose/Rate Route Frequency Ordered Stop   08/08/15 1800  piperacillin-tazobactam (ZOSYN) IVPB 3.375 g     3.375 g 12.5 mL/hr over 240 Minutes Intravenous Every 8 hours 08/08/15 1421     08/08/15 1415  piperacillin-tazobactam (ZOSYN) IVPB 3.375 g  Status:  Discontinued     3.375 g 12.5 mL/hr over 240 Minutes Intravenous 3 times per day 08/08/15 1410 08/08/15 1421   08/08/15 1145  piperacillin-tazobactam (ZOSYN) IVPB 3.375 g     3.375 g 100 mL/hr over 30 Minutes Intravenous  Once 08/08/15 1137 08/08/15 1435      Assessment Active Problems:   Acute appendicitis s/p laparoscopic appendectomy 08/09/15-doing well      Plan:  Discharge.  Instructions given to him.   Jayden Kratochvil J 08/09/2015

## 2015-08-10 NOTE — Anesthesia Postprocedure Evaluation (Signed)
  Anesthesia Post-op Note  Patient: Hunter Combs  Procedure(s) Performed: Procedure(s) (LRB): APPENDECTOMY LAPAROSCOPIC (N/A)  Patient Location: PACU  Anesthesia Type: General  Level of Consciousness: awake and alert   Airway and Oxygen Therapy: Patient Spontanous Breathing  Post-op Pain: mild  Post-op Assessment: Post-op Vital signs reviewed, Patient's Cardiovascular Status Stable, Respiratory Function Stable, Patent Airway and No signs of Nausea or vomiting  Last Vitals:  Filed Vitals:   08/09/15 0534  BP: 111/56  Pulse: 96  Temp: 37.2 C  Resp: 20    Post-op Vital Signs: stable   Complications: No apparent anesthesia complications

## 2015-08-11 ENCOUNTER — Encounter (HOSPITAL_COMMUNITY): Payer: Self-pay | Admitting: General Surgery

## 2017-08-28 ENCOUNTER — Emergency Department (HOSPITAL_COMMUNITY): Payer: No Typology Code available for payment source

## 2017-08-28 ENCOUNTER — Encounter (HOSPITAL_COMMUNITY): Payer: Self-pay | Admitting: Emergency Medicine

## 2017-08-28 DIAGNOSIS — S89202A Unspecified physeal fracture of upper end of left fibula, initial encounter for closed fracture: Secondary | ICD-10-CM | POA: Diagnosis not present

## 2017-08-28 DIAGNOSIS — Y999 Unspecified external cause status: Secondary | ICD-10-CM | POA: Diagnosis not present

## 2017-08-28 DIAGNOSIS — Y939 Activity, unspecified: Secondary | ICD-10-CM | POA: Diagnosis not present

## 2017-08-28 DIAGNOSIS — Z87891 Personal history of nicotine dependence: Secondary | ICD-10-CM | POA: Diagnosis not present

## 2017-08-28 DIAGNOSIS — Z79899 Other long term (current) drug therapy: Secondary | ICD-10-CM | POA: Insufficient documentation

## 2017-08-28 DIAGNOSIS — S82102A Unspecified fracture of upper end of left tibia, initial encounter for closed fracture: Secondary | ICD-10-CM | POA: Diagnosis not present

## 2017-08-28 DIAGNOSIS — M79641 Pain in right hand: Secondary | ICD-10-CM | POA: Diagnosis not present

## 2017-08-28 DIAGNOSIS — S8992XA Unspecified injury of left lower leg, initial encounter: Secondary | ICD-10-CM | POA: Diagnosis present

## 2017-08-28 DIAGNOSIS — Y92410 Unspecified street and highway as the place of occurrence of the external cause: Secondary | ICD-10-CM | POA: Insufficient documentation

## 2017-08-28 LAB — BASIC METABOLIC PANEL
ANION GAP: 7 (ref 5–15)
BUN: 20 mg/dL (ref 6–20)
CO2: 26 mmol/L (ref 22–32)
Calcium: 9.4 mg/dL (ref 8.9–10.3)
Chloride: 105 mmol/L (ref 101–111)
Creatinine, Ser: 1.04 mg/dL (ref 0.61–1.24)
GFR calc non Af Amer: >60 mL/min (ref >60)
Glucose, Bld: 120 mg/dL — ABNORMAL HIGH (ref 65–99)
POTASSIUM: 4.1 mmol/L (ref 3.5–5.1)
SODIUM: 138 mmol/L (ref 135–145)

## 2017-08-28 LAB — CBC WITH DIFFERENTIAL/PLATELET
BASOS ABS: 0 10*3/uL (ref 0.0–0.1)
BASOS PCT: 0 %
EOS ABS: 0 10*3/uL (ref 0.0–0.7)
Eosinophils Relative: 0 %
HCT: 45.7 % (ref 39.0–52.0)
HEMOGLOBIN: 15.7 g/dL (ref 13.0–17.0)
LYMPHS ABS: 2.1 10*3/uL (ref 0.7–4.0)
Lymphocytes Relative: 20 %
MCH: 29.6 pg (ref 26.0–34.0)
MCHC: 34.4 g/dL (ref 30.0–36.0)
MCV: 86.1 fL (ref 78.0–100.0)
Monocytes Absolute: 0.8 10*3/uL (ref 0.1–1.0)
Monocytes Relative: 8 %
NEUTROS PCT: 72 %
Neutro Abs: 7.9 10*3/uL — ABNORMAL HIGH (ref 1.7–7.7)
Platelets: 246 10*3/uL (ref 150–400)
RBC: 5.31 MIL/uL (ref 4.22–5.81)
RDW: 13.8 % (ref 11.5–15.5)
WBC: 11 10*3/uL — AB (ref 4.0–10.5)

## 2017-08-28 MED ORDER — OXYCODONE-ACETAMINOPHEN 5-325 MG PO TABS
1.0000 | ORAL_TABLET | Freq: Once | ORAL | Status: AC
  Administered 2017-08-28: 1 via ORAL

## 2017-08-28 MED ORDER — OXYCODONE-ACETAMINOPHEN 5-325 MG PO TABS
ORAL_TABLET | ORAL | Status: AC
  Filled 2017-08-28: qty 1
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
## 2017-08-29 ENCOUNTER — Emergency Department (HOSPITAL_COMMUNITY)
Admission: EM | Admit: 2017-08-29 | Discharge: 2017-08-29 | Disposition: A | Discharge status: Home / Self Care | Payer: No Typology Code available for payment source | Attending: Emergency Medicine | Admitting: Emergency Medicine

## 2017-08-29 ENCOUNTER — Emergency Department (HOSPITAL_COMMUNITY): Payer: No Typology Code available for payment source

## 2017-08-29 DIAGNOSIS — S82832A Other fracture of upper and lower end of left fibula, initial encounter for closed fracture: Secondary | ICD-10-CM

## 2017-08-29 DIAGNOSIS — S82142A Displaced bicondylar fracture of left tibia, initial encounter for closed fracture: Secondary | ICD-10-CM

## 2017-08-29 MED ORDER — ONDANSETRON HCL 4 MG/2ML IJ SOLN
4.0000 mg | Freq: Once | INTRAMUSCULAR | Status: AC
  Administered 2017-08-29: 4 mg via INTRAVENOUS
  Filled 2017-08-29: qty 2

## 2017-08-29 MED ORDER — HYDROMORPHONE HCL 1 MG/ML IJ SOLN
1.0000 mg | Freq: Once | INTRAMUSCULAR | Status: AC
  Administered 2017-08-29: 1 mg via INTRAVENOUS
  Filled 2017-08-29: qty 1

## 2017-08-29 MED ORDER — HYDROCODONE-ACETAMINOPHEN 5-325 MG PO TABS
1.0000 | ORAL_TABLET | Freq: Once | ORAL | Status: AC
  Administered 2017-08-29: 1 via ORAL
  Filled 2017-08-29: qty 1

## 2017-08-29 MED ORDER — HYDROCODONE-ACETAMINOPHEN 5-325 MG PO TABS: 1.0000 | ORAL_TABLET | ORAL | 0 refills | Status: AC | PRN

## 2017-08-29 NOTE — ED Notes (Signed)
Patient transported to CT 
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
## 2017-08-29 NOTE — Progress Notes (Signed)
Orthopedic Tech Progress Note Patient Details:  Hunter Combs 1976/01/22 [id number]  Ortho Devices Type of Ortho Device: Crutches, Post (long leg) splint Ortho Device/Splint Location: lle Ortho Device/Splint Interventions: Ordered, Application   Trinna PostMartinez, Breeanne Oblinger J 08/29/2017, 6:49 AM
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
## 2020-08-12 ENCOUNTER — Other Ambulatory Visit (HOSPITAL_BASED_OUTPATIENT_CLINIC_OR_DEPARTMENT_OTHER): Payer: Self-pay | Admitting: Emergency Medicine

## 2020-08-12 ENCOUNTER — Emergency Department (HOSPITAL_BASED_OUTPATIENT_CLINIC_OR_DEPARTMENT_OTHER)
Admission: EM | Admit: 2020-08-12 | Discharge: 2020-08-12 | Disposition: A | Discharge status: Home / Self Care | Payer: Self-pay | Attending: Emergency Medicine | Admitting: Emergency Medicine

## 2020-08-12 ENCOUNTER — Encounter (HOSPITAL_BASED_OUTPATIENT_CLINIC_OR_DEPARTMENT_OTHER): Payer: Self-pay | Admitting: *Deleted

## 2020-08-12 ENCOUNTER — Emergency Department (HOSPITAL_BASED_OUTPATIENT_CLINIC_OR_DEPARTMENT_OTHER): Payer: Self-pay

## 2020-08-12 ENCOUNTER — Other Ambulatory Visit: Payer: Self-pay

## 2020-08-12 DIAGNOSIS — Z87891 Personal history of nicotine dependence: Secondary | ICD-10-CM | POA: Insufficient documentation

## 2020-08-12 DIAGNOSIS — G8929 Other chronic pain: Secondary | ICD-10-CM

## 2020-08-12 DIAGNOSIS — M5441 Lumbago with sciatica, right side: Secondary | ICD-10-CM | POA: Insufficient documentation

## 2020-08-12 MED ORDER — GABAPENTIN 100 MG PO CAPS: 100.0000 mg | ORAL_CAPSULE | Freq: Three times a day (TID) | ORAL | 0 refills | Status: DC

## 2020-08-12 MED ORDER — METHOCARBAMOL 500 MG PO TABS: 500.0000 mg | ORAL_TABLET | Freq: Two times a day (BID) | ORAL | 0 refills | Status: DC

## 2020-08-12 MED ORDER — PREDNISONE 20 MG PO TABS: 40.0000 mg | ORAL_TABLET | Freq: Every day | ORAL | 0 refills | Status: DC

## 2020-08-12 MED FILL — predniSONE 20 MG TABS: 20 | 4 days supply | Qty: 8 | Fill #0

## 2020-08-12 MED FILL — METHOCARBAMOL 500 MG TABS: 500 | 10 days supply | Qty: 20 | Fill #0

## 2020-08-12 MED FILL — GABAPENTIN 100 MG CAPSULE: 100 | 14 days supply | Qty: 42 | Fill #0

## 2020-08-12 NOTE — ED Notes (Signed)
Pt discharged to home. Discharge instructions have been discussed with patient and/or family members. Pt verbally acknowledges understanding d/c instructions, and endorses comprehension to checkout at registration before leaving.  °

## 2020-08-12 NOTE — ED Triage Notes (Signed)
Lower back pain for a month. He has had back pain on and off throughout his adult life.

## 2020-08-12 NOTE — ED Notes (Addendum)
Pt endorses tylenol and ibuprofen with no relief. Pt also reports taking a "nerve medication that starts with N" and having nightmares. When asked if Neurontin, pt was unsure of name of medication. EDP updated.

## 2020-08-12 NOTE — Discharge Instructions (Signed)
Take Robaxin as prescribed. This medication will make you drowsy so do not drive or drink alcohol when taking it.  Take prednisone as directed.   Take gabapentin as directed.   Follow up with the referred neurosurgeon's office.   Return to the Emergency Department immediately for any worsening back pain, neck pain, difficulty walking, numbness/weaknss of your arms or legs, urinary or bowel accidents, fever or any other worsening or concerning symptoms.

## 2020-08-12 NOTE — ED Provider Notes (Signed)
[facility] EMERGENCY DEPARTMENT Provider Note   CSN: [id number] Arrival date & time: 08/12/20  1241     History Chief Complaint  Patient presents with  . Back Pain    Hunter Combs is a 44 y.o. male who presents for evaluation of lower back pain.  He states is been ongoing for about a month or so.  He has never followed up.  He did not have any preceding trauma, injury, fall.  He states that over the last several weeks, he is starting to have pain that shoots down the posterior aspect of his right lower extremity.  He states that he occasionally gets some tingling or numbness sensation in his toes.  Numbness is isolated to that region does not extend upward.  He is still been able to ambulate but states that if he walks long distances, he has to stop because it causes too much pain.  He has been taking Tylenol and ibuprofen for his symptoms.  He states he has had symptoms like this previously and states that he was told it was sciatica.  He states that it would intermittently flare but this most recent.  Has been ongoing for longer than normal. Denies fevers, weight loss, numbness/weakness of upper and lower extremities, bowel/bladder incontinence, saddle anesthesia, history of back surgery, history of IVDA.    The history is provided by the patient.       History reviewed. No pertinent past medical history.  Patient Active Problem List   Diagnosis Date Noted  . Acute appendicitis 08/08/2015    Past Surgical History:  Procedure Laterality Date  . LAPAROSCOPIC APPENDECTOMY N/A 08/08/2015   Procedure: APPENDECTOMY LAPAROSCOPIC;  Surgeon: Avel Peace, MD;  Location: [facility] ORS;  Service: General;  Laterality: N/A;  . left inguinal hernia         No family history on file.  Social History   Tobacco Use  . Smoking status: Former Games developer  . Smokeless tobacco: Never Used  Vaping Use  . Vaping Use: Never used  Substance Use Topics  . Alcohol use: No    Alcohol/week:  0.0 standard drinks  . Drug use: No    Home Medications Prior to Admission medications   Medication Sig Start Date End Date Taking? Authorizing Provider  HYDROcodone-acetaminophen (NORCO/VICODIN) 5-325 MG tablet Take 1 tablet every 4 (four) hours as needed by mouth for moderate pain. 08/29/17   Gilda Crease, MD  methocarbamol (ROBAXIN) 500 MG tablet Take 1 tablet (500 mg total) by mouth 2 (two) times daily. 08/12/20   Maxwell Caul, PA-C  oxyCODONE (OXY IR/ROXICODONE) 5 MG immediate release tablet Take 1-2 tablets (5-10 mg total) by mouth every 4 (four) hours as needed for moderate pain. Patient not taking: Reported on 08/29/2017 08/09/15   Avel Peace, MD  predniSONE (DELTASONE) 20 MG tablet Take 2 tablets (40 mg total) by mouth daily for 4 days. 08/12/20 08/16/20  Graciella Freer A, PA-C  gabapentin (NEURONTIN) 100 MG capsule Take 1 capsule (100 mg total) by mouth 3 (three) times daily for 14 days. 08/12/20 08/12/20  Maxwell Caul, PA-C    Allergies    Patient has no known allergies.  Review of Systems   Review of Systems  Constitutional: Negative for fever.  Musculoskeletal: Positive for back pain.  Neurological: Positive for numbness. Negative for weakness.  All other systems reviewed and are negative.   Physical Exam Updated Vital Signs BP (!) 126/112 (BP Location: Right Arm)   Pulse 72  Temp 99 F (37.2 C) (Oral)   Resp 18   Ht 5\' 10"  (1.778 m)   Wt 108.9 kg   SpO2 100%   BMI 34.44 kg/m   Physical Exam Vitals and nursing note reviewed.  Constitutional:      Appearance: He is well-developed.  HENT:     Head: Normocephalic and atraumatic.  Eyes:     General: No scleral icterus.       Right eye: No discharge.        Left eye: No discharge.     Conjunctiva/sclera: Conjunctivae normal.  Neck:     Comments: Full flexion/extension and lateral movement of neck fully intact. No bony midline tenderness. No deformities or crepitus.  Pulmonary:      Effort: Pulmonary effort is normal.  Musculoskeletal:     Comments: No midline tenderness of the T-spine.  Tenderness palpation lower lumbar region since midline.  No deformity or crepitus noted.  Skin:    General: Skin is warm and dry.  Neurological:     Mental Status: He is alert.     Comments: 5/5 strength BUE and BLE Positive SLR on the RLE Sensation intact along major nerve distributions of BLE  Psychiatric:        Speech: Speech normal.        Behavior: Behavior normal.     ED Results / Procedures / Treatments   Labs (all labs ordered are listed, but only abnormal results are displayed) Labs Reviewed - No data to display  EKG None  Radiology DG Lumbar Spine Complete  Result Date: 08/12/2020 CLINICAL DATA:  Low back pain EXAM: LUMBAR SPINE - COMPLETE 4+ VIEW COMPARISON:  CT 08/08/2015 FINDINGS: Alignment is normal. Vertebral body heights are normal. Disc spaces are relatively preserved. Minimal anterior osteophyte at L3-L4. IMPRESSION: Minimal degenerative change at L3-L4. Electronically Signed   By: 08/10/2015 M.D.   On: 08/12/2020 15:07    Procedures Procedures (including critical care time)  Medications Ordered in ED Medications - No data to display  ED Course  I have reviewed the triage vital signs and the nursing notes.  Pertinent labs & imaging results that were available during my care of the patient were reviewed by me and considered in my medical decision making (see chart for details).    MDM Rules/Calculators/A&P                          44 year old male who presents for evaluation of lower back pain that is been ongoing for about a month.  He states that he has had issues with chronic back pain but states that over the last month or so, the symptoms have been prolonged.  He reports pain down the posterior aspect of his right lower extremity.  He states occasionally, he will have some numbness and tingling sensation to his toes.  He is still been able  to ambulate.  Initially arrival, he is afebrile nontoxic-appearing.  Vital signs are stable.  On exam, his tenderness palpation in the lower lumbar region.  Positive straight leg raise.  He denies any current numbness or tingling sensation on my exam and has full sensation.  Patient with no neuro deficits.  No signs of weakness.  Expect this most likely sciatica given his history/physical exam.  Given the prolonged back pain, will obtain x-ray for evaluation of any acute bony abnormality.  History/physical exam concerning for cauda equina, spinal abscess.  X-ray lumbar spine shows  minimal degenerative change L3-L4.  Discussed results with patient.  We will start him on a short course of prednisone, Robaxin for pain.  Will give an outpatient referral to [place] [facility].  At this time, patient exhibits no weakness and is able to ambulate.  No indication for acute emergent MRI at this time.  Discussed with patient that he needs to follow-up with neurosurgery as directed. At this time, patient exhibits no emergent life-threatening condition that require further evaluation in ED. Patient had ample opportunity for questions and discussion. All patient's questions were answered with full understanding. Strict return precautions discussed. Patient expresses understanding and agreement to plan.   Portions of this note were generated with Scientist, clinical (histocompatibility and immunogenetics). Dictation errors may occur despite best attempts at proofreading.  Final Clinical Impression(s) / ED Diagnoses Final diagnoses:  Chronic bilateral low back pain with right-sided sciatica    Rx / DC Orders ED Discharge Orders         Ordered    gabapentin (NEURONTIN) 100 MG capsule  3 times daily,   Status:  Discontinued        08/12/20 1529    methocarbamol (ROBAXIN) 500 MG tablet  2 times daily        08/12/20 1529    predniSONE (DELTASONE) 20 MG tablet  Daily        08/12/20 1529           Maxwell Caul, PA-C 08/12/20  1732    Terrilee Files, MD 08/12/20 1752

## 2020-08-25 ENCOUNTER — Emergency Department (HOSPITAL_BASED_OUTPATIENT_CLINIC_OR_DEPARTMENT_OTHER)
Admission: EM | Admit: 2020-08-25 | Discharge: 2020-08-25 | Disposition: A | Discharge status: Home / Self Care | Payer: 59 | Attending: Emergency Medicine | Admitting: Emergency Medicine

## 2020-08-25 ENCOUNTER — Other Ambulatory Visit: Payer: Self-pay

## 2020-08-25 ENCOUNTER — Encounter (HOSPITAL_BASED_OUTPATIENT_CLINIC_OR_DEPARTMENT_OTHER): Payer: Self-pay

## 2020-08-25 DIAGNOSIS — M5442 Lumbago with sciatica, left side: Secondary | ICD-10-CM | POA: Diagnosis not present

## 2020-08-25 DIAGNOSIS — Z87891 Personal history of nicotine dependence: Secondary | ICD-10-CM | POA: Insufficient documentation

## 2020-08-25 DIAGNOSIS — M5441 Lumbago with sciatica, right side: Secondary | ICD-10-CM | POA: Diagnosis not present

## 2020-08-25 DIAGNOSIS — M545 Low back pain, unspecified: Secondary | ICD-10-CM | POA: Diagnosis present

## 2020-08-25 MED ORDER — NAPROXEN 500 MG PO TABS: 500.0000 mg | ORAL_TABLET | Freq: Two times a day (BID) | ORAL | 0 refills | Status: AC

## 2020-08-25 MED ORDER — LIDOCAINE 5 % EX PTCH: 1.0000 | MEDICATED_PATCH | CUTANEOUS | 0 refills | Status: AC

## 2020-08-25 MED ORDER — CYCLOBENZAPRINE HCL 5 MG PO TABS: 5.0000 mg | ORAL_TABLET | Freq: Two times a day (BID) | ORAL | 0 refills | Status: AC | PRN

## 2020-08-25 MED ORDER — DEXAMETHASONE SODIUM PHOSPHATE 10 MG/ML IJ SOLN
10.0000 mg | Freq: Once | INTRAMUSCULAR | Status: AC
  Administered 2020-08-25: 10 mg via INTRAMUSCULAR
  Filled 2020-08-25: qty 1

## 2020-08-25 MED ORDER — LIDOCAINE 5 % EX PTCH
1.0000 | MEDICATED_PATCH | CUTANEOUS | Status: DC
  Administered 2020-08-25: 1 via TRANSDERMAL
  Filled 2020-08-25: qty 1

## 2020-08-25 NOTE — ED Triage Notes (Signed)
Bilat sciatica pain, hx of same. Was seen here last week for same. Last took tylenol ~530pm

## 2020-08-25 NOTE — ED Provider Notes (Signed)
[facility] EMERGENCY DEPARTMENT Provider Note   CSN: [id number] Arrival date & time: 08/25/20  2037     History Chief Complaint  Patient presents with  . Back Pain    Hunter Combs is a 44 y.o. male presenting for evaluation of back pain.  Patient states he has had back pain for about the past week.  He was seen last week for the same, prescribed medications but states he thought one of them went out of recall so he did not take it.  He does not know which one he did not take.  He reports no new or worsening symptoms.  He continues to have low back pain with radiation to his legs.  This is bilateral.  No new trauma or injury.  No fevers, chills, abdominal pain, loss of bowel bladder control, history of cancer, or history of IVDU.  He has tried to follow-up with neurosurgery as recommended, but been unable to contact the clinic.  He has been taking Tylenol at high doses, and this helps improve his pain.  He is not taking anything else.   HPI     History reviewed. No pertinent past medical history.  Patient Active Problem List   Diagnosis Date Noted  . Acute appendicitis 08/08/2015    Past Surgical History:  Procedure Laterality Date  . LAPAROSCOPIC APPENDECTOMY N/A 08/08/2015   Procedure: APPENDECTOMY LAPAROSCOPIC;  Surgeon: Avel Peace, MD;  Location: [facility];  Service: General;  Laterality: N/A;  . left inguinal hernia         History reviewed. No pertinent family history.  Social History   Tobacco Use  . Smoking status: Former Games developer  . Smokeless tobacco: Never Used  Vaping Use  . Vaping Use: Never used  Substance Use Topics  . Alcohol use: No    Alcohol/week: 0.0 standard drinks  . Drug use: No    Home Medications Prior to Admission medications   Medication Sig Start Date End Date Taking? Authorizing Provider  cyclobenzaprine (FLEXERIL) 5 MG tablet Take 1 tablet (5 mg total) by mouth 2 (two) times daily as needed for muscle spasms. 08/25/20    Armina Galloway, PA-C  HYDROcodone-acetaminophen (NORCO/VICODIN) 5-325 MG tablet Take 1 tablet every 4 (four) hours as needed by mouth for moderate pain. 08/29/17   Gilda Crease, MD  lidocaine (LIDODERM) 5 % Place 1 patch onto the skin daily. Remove & Discard patch within 12 hours or as directed by MD 08/25/20   Lakesha Levinson, PA-C  methocarbamol (ROBAXIN) 500 MG tablet Take 1 tablet (500 mg total) by mouth 2 (two) times daily. 08/12/20   Maxwell Caul, PA-C  naproxen (NAPROSYN) 500 MG tablet Take 1 tablet (500 mg total) by mouth 2 (two) times daily with a meal. 08/25/20   Suzetta Timko, PA-C  oxyCODONE (OXY IR/ROXICODONE) 5 MG immediate release tablet Take 1-2 tablets (5-10 mg total) by mouth every 4 (four) hours as needed for moderate pain. Patient not taking: Reported on 08/29/2017 08/09/15   Avel Peace, MD  gabapentin (NEURONTIN) 100 MG capsule Take 1 capsule (100 mg total) by mouth 3 (three) times daily for 14 days. 08/12/20 08/12/20  Maxwell Caul, PA-C    Allergies    Patient has no known allergies.  Review of Systems   Review of Systems  Musculoskeletal: Positive for back pain.  Hematological: Does not bruise/bleed easily.    Physical Exam Updated Vital Signs BP 137/85   Pulse 79   Temp 98.6 F (37  C) (Oral)   Resp 16   Ht 5' 10.5" (1.791 m)   Wt 111.1 kg   SpO2 98%   BMI 34.66 kg/m   Physical Exam Vitals and nursing note reviewed.  Constitutional:      General: He is not in acute distress.    Appearance: He is well-developed.     Comments: Sitting in the bed in no acute distress  HENT:     Head: Normocephalic and atraumatic.  Eyes:     Conjunctiva/sclera: Conjunctivae normal.     Pupils: Pupils are equal, round, and reactive to light.  Cardiovascular:     Rate and Rhythm: Normal rate and regular rhythm.     Pulses: Normal pulses.  Pulmonary:     Effort: Pulmonary effort is normal. No respiratory distress.     Breath sounds: Normal  breath sounds. No wheezing.  Abdominal:     General: There is no distension.     Palpations: Abdomen is soft. There is no mass.     Tenderness: There is no abdominal tenderness. There is no guarding or rebound.  Musculoskeletal:        General: Normal range of motion.     Cervical back: Normal range of motion and neck supple.     Comments: TTP of bilateral low back musculature.  No increased pain over midline spine.  No step-offs or deformities.  No saddle paresthesias.  Pedal pulses 2+ bilaterally.  Increased pain with straight leg raise bilaterally.  Increased pain when patient stands to walk.  Skin:    General: Skin is warm and dry.     Capillary Refill: Capillary refill takes less than 2 seconds.  Neurological:     Mental Status: He is alert and oriented to person, place, and time.     ED Results / Procedures / Treatments   Labs (all labs ordered are listed, but only abnormal results are displayed) Labs Reviewed - No data to display  EKG None  Radiology No results found.  Procedures Procedures (including critical care time)  Medications Ordered in ED Medications  dexamethasone (DECADRON) injection 10 mg (10 mg Intramuscular Given 08/25/20 2326)    ED Course  I have reviewed the triage vital signs and the nursing notes.  Pertinent labs & imaging results that were available during my care of the patient were reviewed by me and considered in my medical decision making (see chart for details).    MDM Rules/Calculators/A&P                          Patient presenting for evaluation of low back pain.  Physical exam reassuring, neurovascularly intact.  No red flags for back pain.  Pain is reproducible with palpation of the musculature.  Likely musculoskeletal/sciatica.  Patient had x-ray last week which was overall reassuring, I do not believe he needs repeat imaging.  Doubt vertebral injury, infection, spinal cord compression, myelopathy, or cauda equina syndrome.  Will treat  symptomatically with NSAIDs, muscle relaxers, muscle creams.  Patient given information to follow-up with orthopedics.  At this time, patient appears safe for discharge.  Return precautions given.  Patient states he understands and agrees to plan.  Final Clinical Impression(s) / ED Diagnoses Final diagnoses:  Acute bilateral low back pain with bilateral sciatica    Rx / DC Orders ED Discharge Orders         Ordered    cyclobenzaprine (FLEXERIL) 5 MG tablet  2 times daily  PRN        08/25/20 2316    lidocaine (LIDODERM) 5 %  Every 24 hours        08/25/20 2316    naproxen (NAPROSYN) 500 MG tablet  2 times daily with meals        08/25/20 2317           Lakin Romer, PA-C 08/26/20 2323    Molpus, Jonny Ruiz, MD 08/27/20 [phone]

## 2020-08-25 NOTE — Discharge Instructions (Signed)
Take naproxen 2 times a day with meals.  Do not take other anti-inflammatories at the same time (Advil, Motrin, ibuprofen, Aleve). You may supplement with Tylenol if you need further pain control. Use flexeril as needed for muscle stiffness or soreness. Have caution, as this may make you tired or groggy. Do not drive or operate heavy machinery while taking this medication.  Use the Lidoderm patch to help with pain control. Use muscle creams (bengay, icy hot, salonpas) as needed for pain.  Follow up with the orthopedic office listed below as needed for further evaluation.  Return to the ER if you develop high fevers, numbness, loss of bowel or bladder control, or any new or concerning symptoms.

## 2020-09-17 ENCOUNTER — Other Ambulatory Visit (HOSPITAL_COMMUNITY): Payer: Self-pay | Admitting: Neurosurgery

## 2020-09-17 ENCOUNTER — Other Ambulatory Visit: Payer: Self-pay | Admitting: Neurosurgery

## 2020-09-17 DIAGNOSIS — M5441 Lumbago with sciatica, right side: Secondary | ICD-10-CM

## 2020-09-17 DIAGNOSIS — M5442 Lumbago with sciatica, left side: Secondary | ICD-10-CM

## 2022-01-13 IMAGING — CR DG LUMBAR SPINE COMPLETE 4+V
5 series · 5 of 5 positions shown · non-contrast
Comparison: CT 08/08/2015

CLINICAL DATA: Low back pain

EXAM:
LUMBAR SPINE - COMPLETE 4+ VIEW

[t l-spine a.p.]
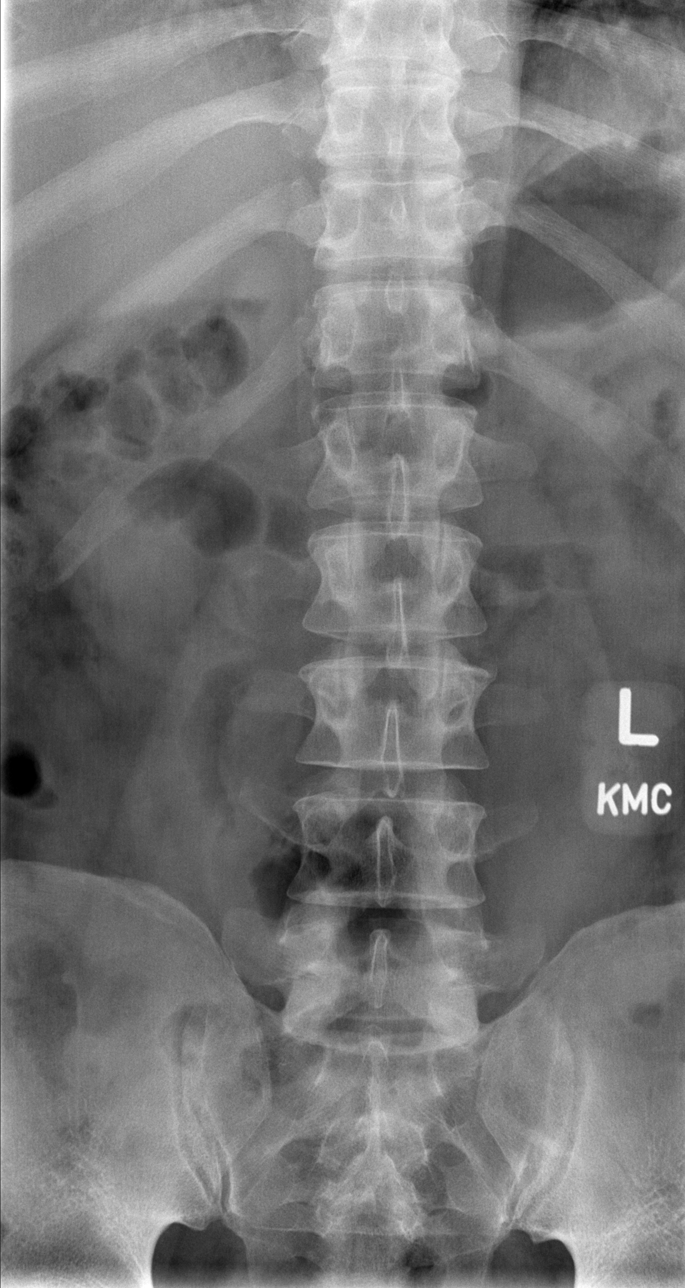

[t l-spine oblique exposure (1 of 2)]
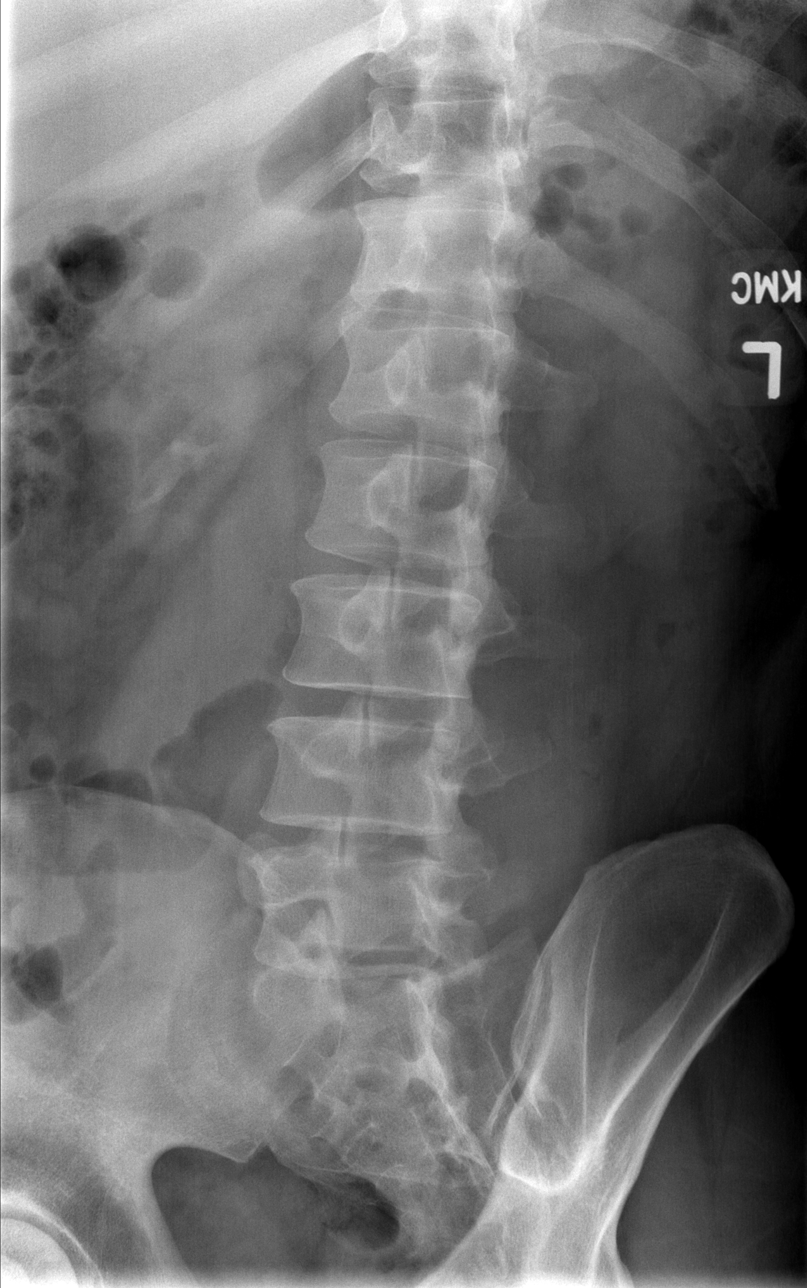

[t l-spine oblique exposure (2 of 2)]
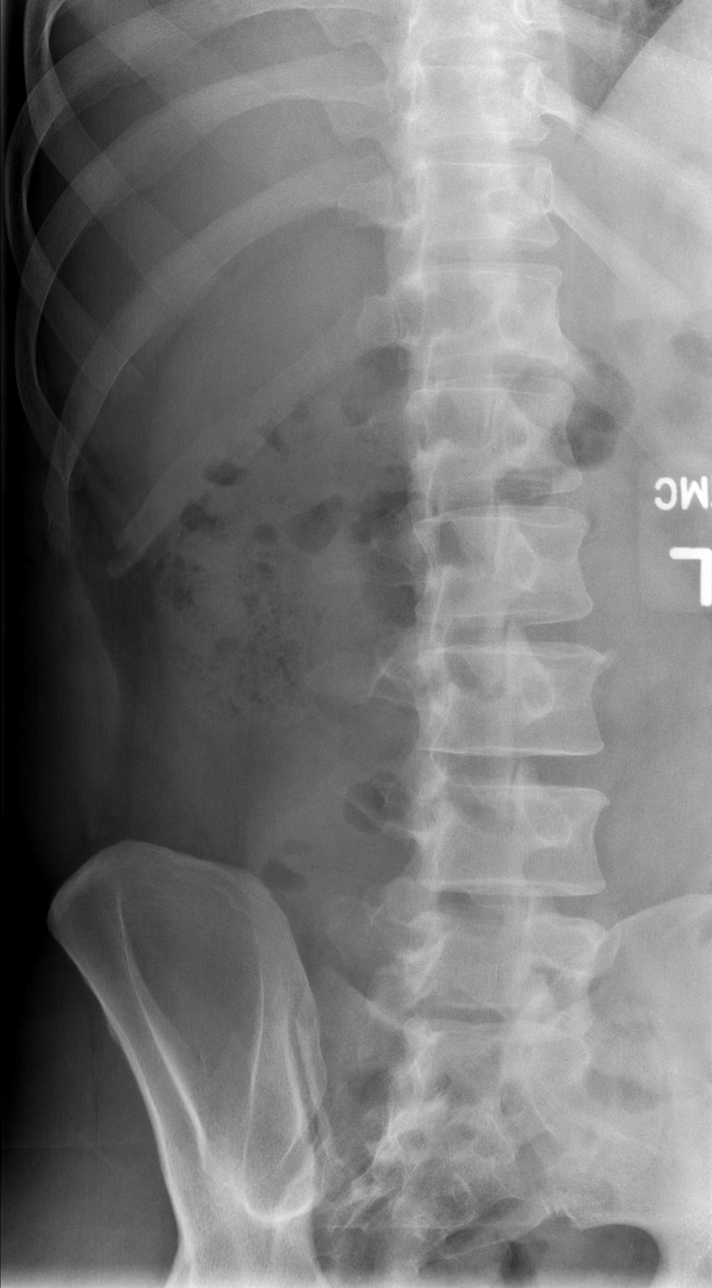

[t l-spine lat]
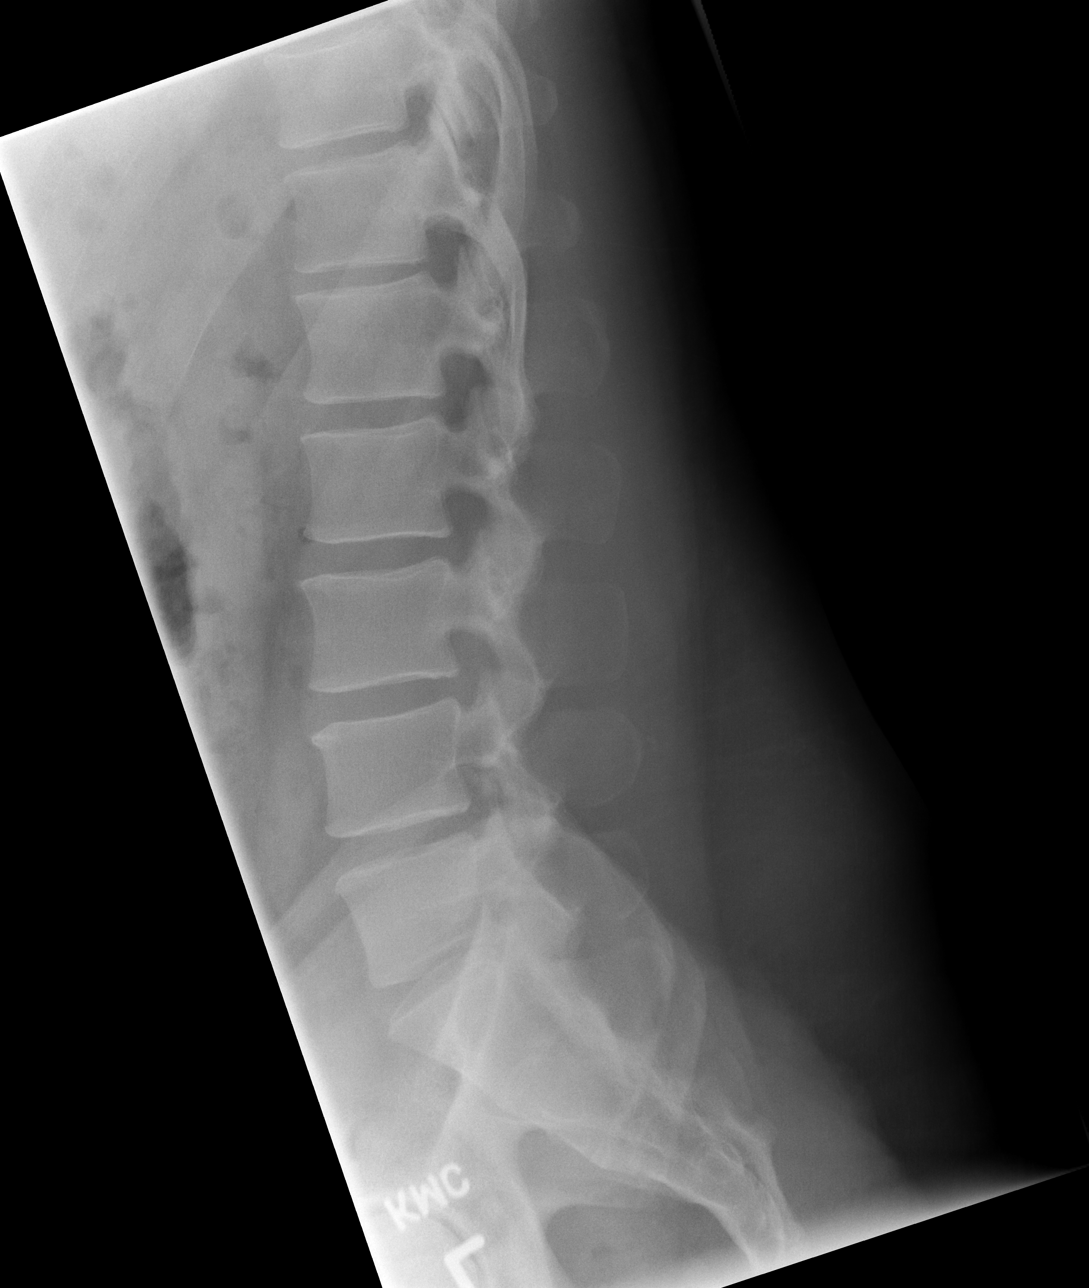

[t l-spine l5-s1 spot]
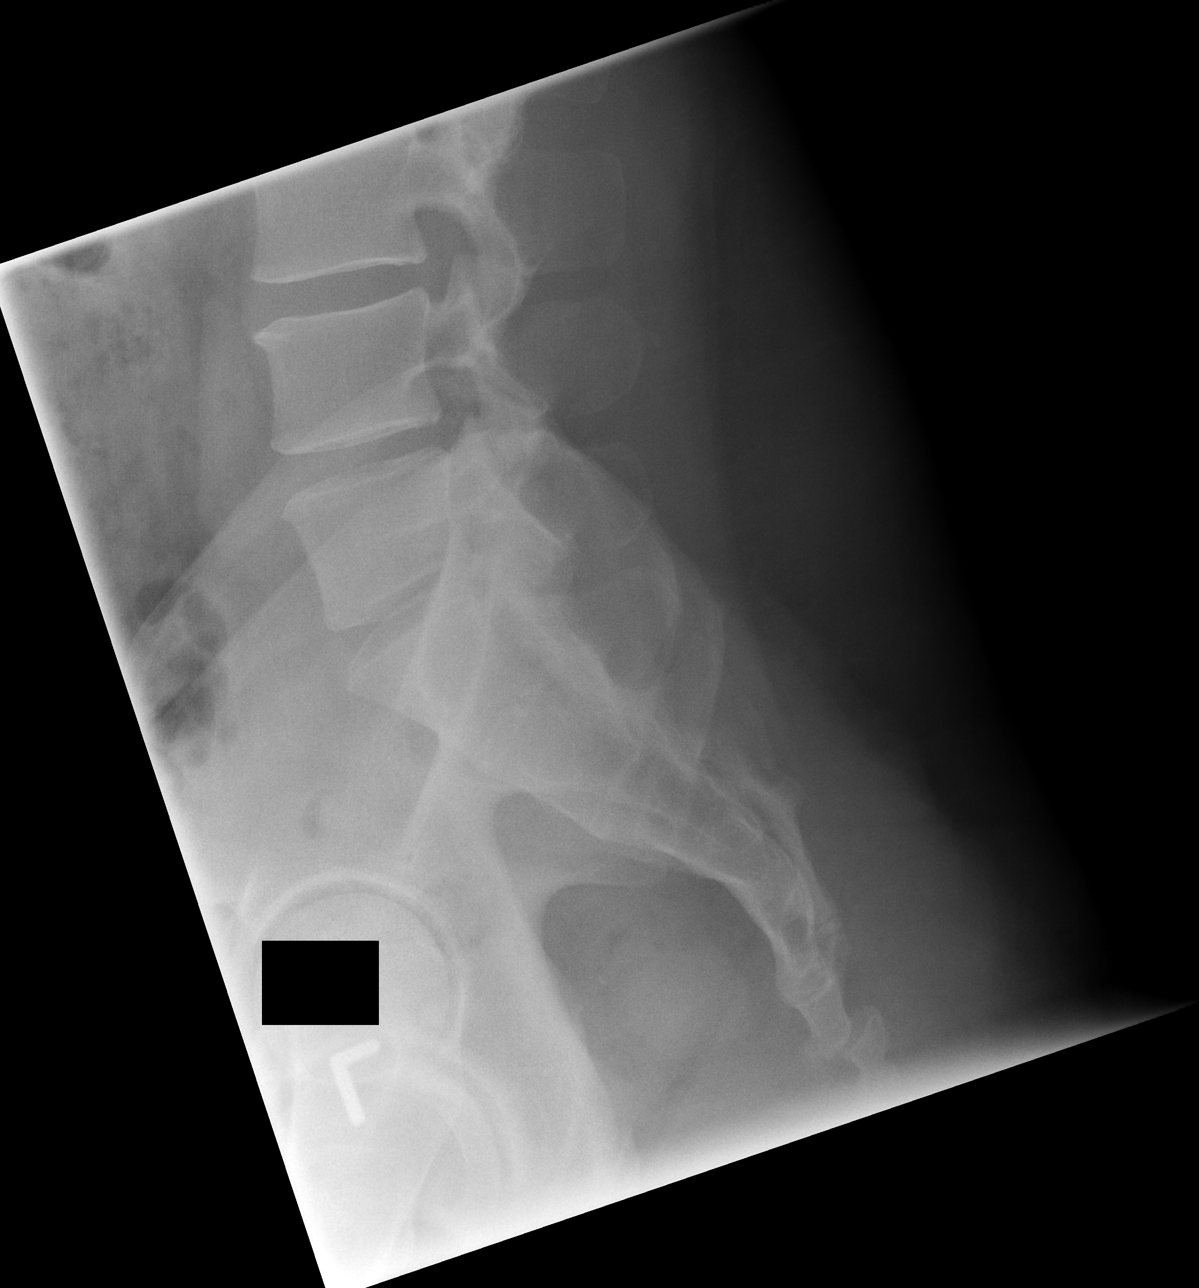

[5 of 5 positions shown; findings below may reference images not displayed]

FINDINGS: Alignment is normal. Vertebral body heights are normal. Disc spaces
are relatively preserved. Minimal anterior osteophyte at L3-L4.
IMPRESSION: Minimal degenerative change at L3-L4.

## 2024-01-18 ENCOUNTER — Other Ambulatory Visit (HOSPITAL_COMMUNITY): Payer: Self-pay | Admitting: Neurological Surgery

## 2024-01-18 DIAGNOSIS — M5416 Radiculopathy, lumbar region: Secondary | ICD-10-CM

## 2024-01-21 ENCOUNTER — Encounter (HOSPITAL_COMMUNITY): Payer: Self-pay

## 2024-01-21 ENCOUNTER — Ambulatory Visit (HOSPITAL_COMMUNITY)
